# Patient Record
Sex: Male | Born: 1940 | Race: White | Hispanic: No | Marital: Married | Smoking: Former smoker
Health system: Southern US, Community
[De-identification: ages and names within clinical notes are randomized; demographics above are authoritative.]

## PROBLEM LIST (undated history)

## (undated) DIAGNOSIS — N400 Enlarged prostate without lower urinary tract symptoms: Secondary | ICD-10-CM

## (undated) DIAGNOSIS — R972 Elevated prostate specific antigen [PSA]: Secondary | ICD-10-CM

## (undated) DIAGNOSIS — K219 Gastro-esophageal reflux disease without esophagitis: Secondary | ICD-10-CM

## (undated) DIAGNOSIS — Z87442 Personal history of urinary calculi: Secondary | ICD-10-CM

## (undated) DIAGNOSIS — I10 Essential (primary) hypertension: Secondary | ICD-10-CM

## (undated) DIAGNOSIS — M1990 Unspecified osteoarthritis, unspecified site: Secondary | ICD-10-CM

## (undated) DIAGNOSIS — D329 Benign neoplasm of meninges, unspecified: Secondary | ICD-10-CM

## (undated) DIAGNOSIS — C4490 Unspecified malignant neoplasm of skin, unspecified: Secondary | ICD-10-CM

## (undated) HISTORY — PX: CYST REMOVAL HAND: SHX6279

## (undated) HISTORY — PX: EYE SURGERY: SHX253

## (undated) HISTORY — PX: VASECTOMY: SHX75

## (undated) HISTORY — PX: WISDOM TOOTH EXTRACTION: SHX21

## (undated) HISTORY — PX: TONSILLECTOMY: SUR1361

---

## 1998-10-20 ENCOUNTER — Other Ambulatory Visit: Admission: RE | Admit: 1998-10-20 | Discharge: 1998-10-20 | Payer: Self-pay | Admitting: Urology

## 2003-05-12 ENCOUNTER — Ambulatory Visit (HOSPITAL_COMMUNITY): Admission: RE | Admit: 2003-05-12 | Discharge: 2003-05-12 | Payer: Self-pay | Admitting: Gastroenterology

## 2003-06-09 ENCOUNTER — Ambulatory Visit (HOSPITAL_BASED_OUTPATIENT_CLINIC_OR_DEPARTMENT_OTHER): Admission: RE | Admit: 2003-06-09 | Discharge: 2003-06-09 | Payer: Self-pay | Admitting: Orthopedic Surgery

## 2003-06-09 ENCOUNTER — Ambulatory Visit (HOSPITAL_COMMUNITY): Admission: RE | Admit: 2003-06-09 | Discharge: 2003-06-09 | Payer: Self-pay | Admitting: Orthopedic Surgery

## 2007-12-05 ENCOUNTER — Encounter: Admission: RE | Admit: 2007-12-05 | Discharge: 2007-12-05 | Payer: Self-pay | Admitting: Family Medicine

## 2008-12-15 ENCOUNTER — Encounter: Admission: RE | Admit: 2008-12-15 | Discharge: 2008-12-15 | Payer: Self-pay | Admitting: Family Medicine

## 2010-08-12 NOTE — Op Note (Signed)
NAMESHAMARI, LOFQUIST                          ACCOUNT NO.:  [id number]   MEDICAL RECORD NO.:  [id number]                   PATIENT TYPE:  AMB   LOCATION:  DSC                                  FACILITY:  [facility]   PHYSICIAN:  Robert A. Thurston Hole, M.D.              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   OPERATION PERFORMED:  Right heel plantar fascia release with calcaneal  exostectomy.   SURGEON:  Elana Alm. Thurston Hole, M.D.   ASSISTANT:  Julien Girt, P.A.   ANESTHESIA:  Popliteal block.   OPERATIVE TIME:  30 minutes.   COMPLICATIONS:  None.   INDICATIONS FOR PROCEDURE:  Mr. Mcentee is a 70 year old gentleman who has  had six months of right heel pain increasing in nature with exam and x-rays  documenting plantar fasciitis, who has failed conservative care and is now  to undergo plantar fascial release.   DESCRIPTION OF PROCEDURE:  Mr. Bogle was brought to the operating room on  June 09, 2003 after a popliteal block had been placed in the holding room  by anesthesia.  He was placed on the operating table in supine position.  His right leg and foot was prepped using sterile DuraPrep and draped using  sterile technique.  The foot and leg was exsanguinated and a calf tourniquet  elevated to 275 mmHg.  Initially, through a 3 cm plantar medial incision  initial exposure was made.  The underlying subcutaneous tissues were incised  in line with a skin incision.  The medial plantar nerve carefully protected  while the plantar fascia was exposed.  A hemostat was placed, one superior  and one inferior to plantar fascia and then a knife was used to cut and  release the plantar fascia off of the calcaneal insertion.  Underneath  this,  a plantar spur was removed with a rongeur.  There was a small amount of  calcification remaining in the soft tissues plantarly and this was left  undisturbed.  Intraoperative fluoroscopy confirmed removal of the spur.  The  wound was then irrigated and closed using 2-0 Vicryl and 3-0 nylon.  Sterile  dressings were applied.  Tourniquet was released.  The patient was taken to  the recovery room in a stable condition.   FOLLOW UP:  Mr. Capek will be followed as an outpatient on Vicodin and  Naprosyn.  See him back in the office in a week for wound check and follow-  up.  Robert A. Thurston Hole, M.D.    RAW/MEDQ  D:  06/09/2003  T:  06/10/2003  Job:  [id number]

## 2010-08-12 NOTE — Op Note (Signed)
NAME:  Jared Shelton, Jared Shelton                          ACCOUNT NO.:  [id number]   MEDICAL RECORD NO.:  [id number]                   PATIENT TYPE:  AMB   LOCATION:  ENDO                                 FACILITY:  [facility]   PHYSICIAN:  John C. Madilyn Fireman, M.D.                 DATE OF BIRTH:  1941/03/13   DATE OF PROCEDURE:  05/12/2003  DATE OF DISCHARGE:                                 OPERATIVE REPORT   PROCEDURE:  Colonoscopy.   INDICATION FOR PROCEDURE:  Rectal bleeding and occasional fecal  incontinence.   DESCRIPTION OF PROCEDURE:  The patient was placed in the left lateral  decubitus position and placed on the pulse monitor with continuous low-flow  oxygen delivered by nasal cannula.  He was sedated with 75 mcg IV fentanyl  and 8 mg IV Versed.  The Olympus video colonoscope was inserted into the  rectum and advanced to the cecum, confirmed by transillumination of  McBurney's point and visualization of the ileocecal valve and appendiceal  orifice.  The prep was excellent.  The cecum, ascending, transverse,  descending, and sigmoid colon all appeared normal with no masses, polyps,  diverticula, or other mucosal abnormalities.  The rectum appeared normal  down to the anus, where there were some somewhat stretched internal  hemorrhoids without stigma or hemorrhage.  The scope was then withdrawn and  the patient returned to the recovery room in stable condition.  He tolerated  the procedure well, and there were no immediate complications.   IMPRESSION:  Enlarged internal hemorrhoids, otherwise normal study.   PLAN:  Will try Anusol-HC suppositories and consider next colon screening by  sigmoidoscopy in five years or colonoscopy in 10 years.                                               John C. Madilyn Fireman, M.D.    JCH/MEDQ  D:  05/12/2003  T:  05/12/2003  Job:  [id number]   cc:   Donia Guiles, M.D.  [address] [place]  [place] [address]  Fax: [phone]

## 2012-12-16 ENCOUNTER — Other Ambulatory Visit: Payer: Self-pay | Admitting: Gastroenterology

## 2012-12-16 DIAGNOSIS — R1310 Dysphagia, unspecified: Secondary | ICD-10-CM

## 2012-12-23 ENCOUNTER — Ambulatory Visit
Admission: RE | Admit: 2012-12-23 | Discharge: 2012-12-23 | Disposition: A | Discharge status: Home / Self Care | Payer: Medicare Other | Source: Ambulatory Visit | Attending: Gastroenterology | Admitting: Gastroenterology

## 2012-12-23 DIAGNOSIS — R1310 Dysphagia, unspecified: Secondary | ICD-10-CM

## 2015-05-03 DIAGNOSIS — E782 Mixed hyperlipidemia: Secondary | ICD-10-CM | POA: Diagnosis not present

## 2015-05-03 DIAGNOSIS — E669 Obesity, unspecified: Secondary | ICD-10-CM | POA: Diagnosis not present

## 2015-05-03 DIAGNOSIS — K5790 Diverticulosis of intestine, part unspecified, without perforation or abscess without bleeding: Secondary | ICD-10-CM | POA: Diagnosis not present

## 2015-05-03 DIAGNOSIS — N400 Enlarged prostate without lower urinary tract symptoms: Secondary | ICD-10-CM | POA: Diagnosis not present

## 2015-05-03 DIAGNOSIS — N529 Male erectile dysfunction, unspecified: Secondary | ICD-10-CM | POA: Diagnosis not present

## 2015-05-03 DIAGNOSIS — M1990 Unspecified osteoarthritis, unspecified site: Secondary | ICD-10-CM | POA: Diagnosis not present

## 2015-05-03 DIAGNOSIS — L719 Rosacea, unspecified: Secondary | ICD-10-CM | POA: Diagnosis not present

## 2015-05-03 DIAGNOSIS — Z6831 Body mass index (BMI) 31.0-31.9, adult: Secondary | ICD-10-CM | POA: Diagnosis not present

## 2015-05-03 DIAGNOSIS — Z0000 Encounter for general adult medical examination without abnormal findings: Secondary | ICD-10-CM | POA: Diagnosis not present

## 2015-05-03 DIAGNOSIS — I10 Essential (primary) hypertension: Secondary | ICD-10-CM | POA: Diagnosis not present

## 2015-05-03 DIAGNOSIS — K219 Gastro-esophageal reflux disease without esophagitis: Secondary | ICD-10-CM | POA: Diagnosis not present

## 2015-05-06 DIAGNOSIS — R509 Fever, unspecified: Secondary | ICD-10-CM | POA: Diagnosis not present

## 2015-05-06 DIAGNOSIS — N400 Enlarged prostate without lower urinary tract symptoms: Secondary | ICD-10-CM | POA: Diagnosis not present

## 2015-05-06 DIAGNOSIS — J111 Influenza due to unidentified influenza virus with other respiratory manifestations: Secondary | ICD-10-CM | POA: Diagnosis not present

## 2015-07-26 DIAGNOSIS — H2513 Age-related nuclear cataract, bilateral: Secondary | ICD-10-CM | POA: Diagnosis not present

## 2015-07-26 DIAGNOSIS — H25013 Cortical age-related cataract, bilateral: Secondary | ICD-10-CM | POA: Diagnosis not present

## 2015-11-03 DIAGNOSIS — N400 Enlarged prostate without lower urinary tract symptoms: Secondary | ICD-10-CM | POA: Diagnosis not present

## 2015-11-03 DIAGNOSIS — E669 Obesity, unspecified: Secondary | ICD-10-CM | POA: Diagnosis not present

## 2015-11-03 DIAGNOSIS — E782 Mixed hyperlipidemia: Secondary | ICD-10-CM | POA: Diagnosis not present

## 2015-11-03 DIAGNOSIS — I10 Essential (primary) hypertension: Secondary | ICD-10-CM | POA: Diagnosis not present

## 2015-11-03 DIAGNOSIS — H01009 Unspecified blepharitis unspecified eye, unspecified eyelid: Secondary | ICD-10-CM | POA: Diagnosis not present

## 2016-05-10 DIAGNOSIS — H524 Presbyopia: Secondary | ICD-10-CM | POA: Diagnosis not present

## 2016-06-19 DIAGNOSIS — L719 Rosacea, unspecified: Secondary | ICD-10-CM | POA: Diagnosis not present

## 2016-06-19 DIAGNOSIS — I10 Essential (primary) hypertension: Secondary | ICD-10-CM | POA: Diagnosis not present

## 2016-06-19 DIAGNOSIS — E782 Mixed hyperlipidemia: Secondary | ICD-10-CM | POA: Diagnosis not present

## 2016-06-19 DIAGNOSIS — K5790 Diverticulosis of intestine, part unspecified, without perforation or abscess without bleeding: Secondary | ICD-10-CM | POA: Diagnosis not present

## 2016-06-19 DIAGNOSIS — Z0000 Encounter for general adult medical examination without abnormal findings: Secondary | ICD-10-CM | POA: Diagnosis not present

## 2016-06-19 DIAGNOSIS — J309 Allergic rhinitis, unspecified: Secondary | ICD-10-CM | POA: Diagnosis not present

## 2016-06-19 DIAGNOSIS — K219 Gastro-esophageal reflux disease without esophagitis: Secondary | ICD-10-CM | POA: Diagnosis not present

## 2016-06-19 DIAGNOSIS — E669 Obesity, unspecified: Secondary | ICD-10-CM | POA: Diagnosis not present

## 2016-06-19 DIAGNOSIS — M1990 Unspecified osteoarthritis, unspecified site: Secondary | ICD-10-CM | POA: Diagnosis not present

## 2016-06-19 DIAGNOSIS — N529 Male erectile dysfunction, unspecified: Secondary | ICD-10-CM | POA: Diagnosis not present

## 2016-06-19 DIAGNOSIS — N400 Enlarged prostate without lower urinary tract symptoms: Secondary | ICD-10-CM | POA: Diagnosis not present

## 2016-07-11 DIAGNOSIS — H25043 Posterior subcapsular polar age-related cataract, bilateral: Secondary | ICD-10-CM | POA: Diagnosis not present

## 2016-07-11 DIAGNOSIS — H2511 Age-related nuclear cataract, right eye: Secondary | ICD-10-CM | POA: Diagnosis not present

## 2016-07-11 DIAGNOSIS — H25013 Cortical age-related cataract, bilateral: Secondary | ICD-10-CM | POA: Diagnosis not present

## 2016-07-11 DIAGNOSIS — H02839 Dermatochalasis of unspecified eye, unspecified eyelid: Secondary | ICD-10-CM | POA: Diagnosis not present

## 2016-07-11 DIAGNOSIS — H2513 Age-related nuclear cataract, bilateral: Secondary | ICD-10-CM | POA: Diagnosis not present

## 2016-07-28 DIAGNOSIS — H2511 Age-related nuclear cataract, right eye: Secondary | ICD-10-CM | POA: Diagnosis not present

## 2016-07-28 DIAGNOSIS — H25811 Combined forms of age-related cataract, right eye: Secondary | ICD-10-CM | POA: Diagnosis not present

## 2016-08-29 DIAGNOSIS — R194 Change in bowel habit: Secondary | ICD-10-CM | POA: Diagnosis not present

## 2017-01-10 ENCOUNTER — Other Ambulatory Visit: Payer: Self-pay | Admitting: Family Medicine

## 2017-01-10 DIAGNOSIS — R197 Diarrhea, unspecified: Secondary | ICD-10-CM | POA: Diagnosis not present

## 2017-01-10 DIAGNOSIS — Z23 Encounter for immunization: Secondary | ICD-10-CM | POA: Diagnosis not present

## 2017-01-10 DIAGNOSIS — R109 Unspecified abdominal pain: Secondary | ICD-10-CM

## 2017-01-15 ENCOUNTER — Ambulatory Visit
Admission: RE | Admit: 2017-01-15 | Discharge: 2017-01-15 | Disposition: A | Discharge status: Home / Self Care | Payer: PPO | Source: Ambulatory Visit | Attending: Family Medicine | Admitting: Family Medicine

## 2017-01-15 DIAGNOSIS — K5730 Diverticulosis of large intestine without perforation or abscess without bleeding: Secondary | ICD-10-CM | POA: Diagnosis not present

## 2017-01-15 DIAGNOSIS — R109 Unspecified abdominal pain: Secondary | ICD-10-CM

## 2017-01-15 MED ORDER — IOPAMIDOL (ISOVUE-300) INJECTION 61%
100.0000 mL | Freq: Once | INTRAVENOUS | Status: AC | PRN
  Administered 2017-01-15: 100 mL via INTRAVENOUS

## 2017-06-22 DIAGNOSIS — J329 Chronic sinusitis, unspecified: Secondary | ICD-10-CM | POA: Diagnosis not present

## 2017-06-22 DIAGNOSIS — R51 Headache: Secondary | ICD-10-CM | POA: Diagnosis not present

## 2017-06-29 DIAGNOSIS — K219 Gastro-esophageal reflux disease without esophagitis: Secondary | ICD-10-CM | POA: Diagnosis not present

## 2017-06-29 DIAGNOSIS — J309 Allergic rhinitis, unspecified: Secondary | ICD-10-CM | POA: Diagnosis not present

## 2017-06-29 DIAGNOSIS — Z125 Encounter for screening for malignant neoplasm of prostate: Secondary | ICD-10-CM | POA: Diagnosis not present

## 2017-06-29 DIAGNOSIS — L719 Rosacea, unspecified: Secondary | ICD-10-CM | POA: Diagnosis not present

## 2017-06-29 DIAGNOSIS — N400 Enlarged prostate without lower urinary tract symptoms: Secondary | ICD-10-CM | POA: Diagnosis not present

## 2017-06-29 DIAGNOSIS — I10 Essential (primary) hypertension: Secondary | ICD-10-CM | POA: Diagnosis not present

## 2017-06-29 DIAGNOSIS — K5790 Diverticulosis of intestine, part unspecified, without perforation or abscess without bleeding: Secondary | ICD-10-CM | POA: Diagnosis not present

## 2017-06-29 DIAGNOSIS — R972 Elevated prostate specific antigen [PSA]: Secondary | ICD-10-CM | POA: Diagnosis not present

## 2017-06-29 DIAGNOSIS — N529 Male erectile dysfunction, unspecified: Secondary | ICD-10-CM | POA: Diagnosis not present

## 2017-06-29 DIAGNOSIS — M1990 Unspecified osteoarthritis, unspecified site: Secondary | ICD-10-CM | POA: Diagnosis not present

## 2017-06-29 DIAGNOSIS — E782 Mixed hyperlipidemia: Secondary | ICD-10-CM | POA: Diagnosis not present

## 2017-06-29 DIAGNOSIS — Z0000 Encounter for general adult medical examination without abnormal findings: Secondary | ICD-10-CM | POA: Diagnosis not present

## 2017-11-19 ENCOUNTER — Encounter (HOSPITAL_COMMUNITY): Payer: Self-pay | Admitting: Emergency Medicine

## 2017-11-19 ENCOUNTER — Emergency Department (HOSPITAL_COMMUNITY)
Admission: EM | Admit: 2017-11-19 | Discharge: 2017-11-19 | Disposition: A | Discharge status: Home / Self Care | Payer: PPO | Attending: Emergency Medicine | Admitting: Emergency Medicine

## 2017-11-19 DIAGNOSIS — Z7902 Long term (current) use of antithrombotics/antiplatelets: Secondary | ICD-10-CM | POA: Insufficient documentation

## 2017-11-19 DIAGNOSIS — R55 Syncope and collapse: Secondary | ICD-10-CM | POA: Diagnosis not present

## 2017-11-19 DIAGNOSIS — I10 Essential (primary) hypertension: Secondary | ICD-10-CM | POA: Insufficient documentation

## 2017-11-19 DIAGNOSIS — Z7982 Long term (current) use of aspirin: Secondary | ICD-10-CM | POA: Insufficient documentation

## 2017-11-19 DIAGNOSIS — Z79899 Other long term (current) drug therapy: Secondary | ICD-10-CM | POA: Diagnosis not present

## 2017-11-19 DIAGNOSIS — I959 Hypotension, unspecified: Secondary | ICD-10-CM | POA: Diagnosis not present

## 2017-11-19 DIAGNOSIS — W19XXXA Unspecified fall, initial encounter: Secondary | ICD-10-CM | POA: Diagnosis not present

## 2017-11-19 HISTORY — DX: Essential (primary) hypertension: I10

## 2017-11-19 LAB — CBC WITH DIFFERENTIAL/PLATELET
Abs Immature Granulocytes: 0.1 10*3/uL (ref 0.0–0.1)
Basophils Absolute: 0.1 10*3/uL (ref 0.0–0.1)
Basophils Relative: 1 %
Eosinophils Absolute: 0.2 10*3/uL (ref 0.0–0.7)
Eosinophils Relative: 2 %
HEMATOCRIT: 44.7 % (ref 39.0–52.0)
HEMOGLOBIN: 14.3 g/dL (ref 13.0–17.0)
IMMATURE GRANULOCYTES: 1 %
LYMPHS ABS: 1.8 10*3/uL (ref 0.7–4.0)
LYMPHS PCT: 13 %
MCH: 28.3 pg (ref 26.0–34.0)
MCHC: 32 g/dL (ref 30.0–36.0)
MCV: 88.3 fL (ref 78.0–100.0)
Monocytes Absolute: 1 10*3/uL (ref 0.1–1.0)
Monocytes Relative: 7 %
NEUTROS PCT: 76 %
Neutro Abs: 10.8 10*3/uL — ABNORMAL HIGH (ref 1.7–7.7)
Platelets: 209 10*3/uL (ref 150–400)
RBC: 5.06 MIL/uL (ref 4.22–5.81)
RDW: 13.1 % (ref 11.5–15.5)
WBC: 13.9 10*3/uL — AB (ref 4.0–10.5)

## 2017-11-19 LAB — BASIC METABOLIC PANEL
Anion gap: 7 (ref 5–15)
BUN: 18 mg/dL (ref 8–23)
CHLORIDE: 106 mmol/L (ref 98–111)
CO2: 25 mmol/L (ref 22–32)
Calcium: 8.5 mg/dL — ABNORMAL LOW (ref 8.9–10.3)
Creatinine, Ser: 1.34 mg/dL — ABNORMAL HIGH (ref 0.61–1.24)
GFR calc non Af Amer: 50 mL/min — ABNORMAL LOW (ref >60)
GFR, EST AFRICAN AMERICAN: 58 mL/min — AB (ref >60)
Glucose, Bld: 105 mg/dL — ABNORMAL HIGH (ref 70–99)
Potassium: 4.5 mmol/L (ref 3.5–5.1)
SODIUM: 138 mmol/L (ref 135–145)

## 2017-11-19 MED ORDER — SODIUM CHLORIDE 0.9 % IV BOLUS
1000.0000 mL | Freq: Once | INTRAVENOUS | Status: AC
  Administered 2017-11-19: 1000 mL via INTRAVENOUS

## 2017-11-19 NOTE — Discharge Instructions (Signed)
If you develop recurrent lightheadedness, pass out again, or develop headache, vomiting, chest pain, shortness of breath, or palpitations or any other new/concerning symptoms and return to the ER for evaluation.  Otherwise follow-up closely with your primary care physician.

## 2017-11-19 NOTE — ED Provider Notes (Signed)
[facility] EMERGENCY DEPARTMENT Provider Note   CSN: [id number] Arrival date & time: 11/19/17  1247     History   Chief Complaint Chief Complaint  Patient presents with  . Loss of Consciousness    HPI Jared Shelton. is a 77 y.o. male.  HPI  77 year old male with a history of hypertension and hyperlipidemia presents after syncope.  The patient states he was outside watching workers take down a tree at his house.  He was bending over to look at branches and immediately felt lightheaded and passed out.  After feeling lightheaded the next thing he remembers was waking up with people around him.  Still felt lightheaded until halfway here.  Now feels fine.  Over the years he is intermittently felt brief lightheadedness that seems to improve whenever he sits down.  This is been ongoing for many years and rarely occurs.  Today there has been no headache, shortness of breath, chest pain, vomiting, or weakness/numbness.  He has not been feeling ill prior to this.  No recent change in his medications. No palpitations  Past Medical History:  Diagnosis Date  . Hypertension     There are no active problems to display for this patient.   History reviewed. No pertinent surgical history.      Home Medications    Prior to Admission medications   Medication Sig Start Date End Date Taking? Authorizing Provider  acetaminophen (TYLENOL) 500 MG tablet Take 500-1,000 mg by mouth every 6 (six) hours as needed for headache (pain).   Yes [provider]  amLODipine (NORVASC) 10 MG tablet Take 10 mg by mouth daily. 11/14/17  Yes [provider]  aspirin EC 81 MG tablet Take 81 mg by mouth daily.   Yes [provider]  OVER THE COUNTER MEDICATION Place 1 drop into both eyes daily as needed (dry eyes). Over the counter lubircating eye drop   Yes [provider]  pravastatin (PRAVACHOL) 40 MG tablet Take 40 mg by mouth daily. 09/03/17  Yes  [provider]    Family History No family history on file.  Social History Social History   Tobacco Use  . Smoking status: Not on file  Substance Use Topics  . Alcohol use: Not on file  . Drug use: Not on file     Allergies   Patient has no known allergies.   Review of Systems Review of Systems  Constitutional: Negative for fever.  Respiratory: Negative for shortness of breath.   Cardiovascular: Negative for chest pain, palpitations and leg swelling.  Gastrointestinal: Negative for abdominal pain, nausea and vomiting.  Neurological: Positive for syncope and light-headedness.  All other systems reviewed and are negative.    Physical Exam Updated Vital Signs BP 137/78   Pulse 60   Temp 98.1 F (36.7 C) (Oral)   Resp 20   SpO2 97%   Physical Exam  Constitutional: He is oriented to person, place, and time. He appears well-developed and well-nourished. No distress.  HENT:  Head: Normocephalic and atraumatic.  Right Ear: External ear normal.  Left Ear: External ear normal.  Nose: Nose normal.  Eyes: Pupils are equal, round, and reactive to light. EOM are normal. Right eye exhibits no discharge. Left eye exhibits no discharge.  Neck: Neck supple.  Cardiovascular: Normal rate, regular rhythm and normal heart sounds.  No murmur heard. Pulmonary/Chest: Effort normal and breath sounds normal.  Abdominal: Soft. There is no tenderness.  Musculoskeletal: He exhibits no  edema.  Neurological: He is alert and oriented to person, place, and time.  CN 3-12 grossly intact. 5/5 strength in all 4 extremities. Grossly normal sensation. Normal finger to nose.   Skin: Skin is warm and dry. He is not diaphoretic.  Nursing note and vitals reviewed.    ED Treatments / Results  Labs (all labs ordered are listed, but only abnormal results are displayed) Labs Reviewed  BASIC METABOLIC PANEL - Abnormal; Notable for the following components:      Result Value   Glucose,  Bld 105 (*)    Creatinine, Ser 1.34 (*)    Calcium 8.5 (*)    GFR calc non Af Amer 50 (*)    GFR calc Af Amer 58 (*)    All other components within normal limits  CBC WITH DIFFERENTIAL/PLATELET - Abnormal; Notable for the following components:   WBC 13.9 (*)    Neutro Abs 10.8 (*)    All other components within normal limits    EKG EKG Interpretation  Date/Time:  Monday November 19 2017 12:58:04 EDT Ventricular Rate:  54 PR Interval:    QRS Duration: 88 QT Interval:  425 QTC Calculation: 403 R Axis:   -40 Text Interpretation:  Sinus bradycardia Left axis deviation Minimal ST elevation, anterior leads no significant change since 2005 Confirmed by Sherwood Gambler [phone]) on 11/19/2017 1:03:40 PM   Radiology No results found.  Procedures Procedures (including critical care time)  Medications Ordered in ED Medications  sodium chloride 0.9 % bolus 1,000 mL (0 mLs Intravenous Stopped 11/19/17 1518)     Initial Impression / Assessment and Plan / ED Course  I have reviewed the triage vital signs and the nursing notes.  Pertinent labs & imaging results that were available during my care of the patient were reviewed by me and considered in my medical decision making (see chart for details).     Patient continues to feel well.  No cardiac symptoms such as chest pain, dyspnea, or palpitations.  Labs do not show any acute emergent abnormalities.  He was given IV fluids.  Unclear why his WBC is elevated but he has no acute infectious symptoms and has not been feeling ill prior to this.  Most likely this is related to the acute positional change.  Given his age I discussed possible arrhythmia and he understands the possible severity of this but was still wants to go home.  Thus I think is reasonable for him to go home but we discussed strict return precautions.  I highly doubt ACS, PE, or acute neurologic abnormality with no other symptoms.  Discharged home with return precautions.  Final  Clinical Impressions(s) / ED Diagnoses   Final diagnoses:  Syncope and collapse    ED Discharge Orders    None       Sherwood Gambler, MD 11/19/17 [phone]

## 2017-11-19 NOTE — ED Triage Notes (Signed)
Pt arrives from home by ems after having a syncopal event while outside watching a tree service cut down trees. Pt states about 1 hour ago he was outside and bent down to look at a tree limb and remembers getting dizzy and then woke up on the ground. Pt states now he feels normal, no chest pain or dizziness.  EKG sinus brady.

## 2017-11-20 DIAGNOSIS — R42 Dizziness and giddiness: Secondary | ICD-10-CM | POA: Diagnosis not present

## 2017-11-20 DIAGNOSIS — M25551 Pain in right hip: Secondary | ICD-10-CM | POA: Diagnosis not present

## 2017-11-20 DIAGNOSIS — S00412A Abrasion of left ear, initial encounter: Secondary | ICD-10-CM | POA: Diagnosis not present

## 2018-01-02 ENCOUNTER — Ambulatory Visit
Admission: RE | Admit: 2018-01-02 | Discharge: 2018-01-02 | Disposition: A | Discharge status: Home / Self Care | Payer: PPO | Source: Ambulatory Visit | Attending: Family Medicine | Admitting: Family Medicine

## 2018-01-02 ENCOUNTER — Other Ambulatory Visit: Payer: Self-pay | Admitting: Family Medicine

## 2018-01-02 DIAGNOSIS — N400 Enlarged prostate without lower urinary tract symptoms: Secondary | ICD-10-CM | POA: Diagnosis not present

## 2018-01-02 DIAGNOSIS — I10 Essential (primary) hypertension: Secondary | ICD-10-CM | POA: Diagnosis not present

## 2018-01-02 DIAGNOSIS — M25551 Pain in right hip: Secondary | ICD-10-CM

## 2018-01-02 DIAGNOSIS — Z6831 Body mass index (BMI) 31.0-31.9, adult: Secondary | ICD-10-CM | POA: Diagnosis not present

## 2018-01-02 DIAGNOSIS — R972 Elevated prostate specific antigen [PSA]: Secondary | ICD-10-CM | POA: Diagnosis not present

## 2018-01-02 DIAGNOSIS — R400 Somnolence: Secondary | ICD-10-CM | POA: Diagnosis not present

## 2018-01-02 DIAGNOSIS — Z23 Encounter for immunization: Secondary | ICD-10-CM | POA: Diagnosis not present

## 2018-01-02 DIAGNOSIS — L989 Disorder of the skin and subcutaneous tissue, unspecified: Secondary | ICD-10-CM | POA: Diagnosis not present

## 2018-01-02 DIAGNOSIS — M1611 Unilateral primary osteoarthritis, right hip: Secondary | ICD-10-CM | POA: Diagnosis not present

## 2018-01-02 DIAGNOSIS — E782 Mixed hyperlipidemia: Secondary | ICD-10-CM | POA: Diagnosis not present

## 2018-01-02 DIAGNOSIS — E669 Obesity, unspecified: Secondary | ICD-10-CM | POA: Diagnosis not present

## 2018-01-02 DIAGNOSIS — R5383 Other fatigue: Secondary | ICD-10-CM | POA: Diagnosis not present

## 2018-01-14 DIAGNOSIS — M25551 Pain in right hip: Secondary | ICD-10-CM | POA: Diagnosis not present

## 2018-01-22 DIAGNOSIS — M25551 Pain in right hip: Secondary | ICD-10-CM | POA: Diagnosis not present

## 2018-01-29 DIAGNOSIS — M25551 Pain in right hip: Secondary | ICD-10-CM | POA: Diagnosis not present

## 2018-10-21 DIAGNOSIS — R972 Elevated prostate specific antigen [PSA]: Secondary | ICD-10-CM | POA: Diagnosis not present

## 2018-10-21 DIAGNOSIS — I10 Essential (primary) hypertension: Secondary | ICD-10-CM | POA: Diagnosis not present

## 2018-10-21 DIAGNOSIS — Z125 Encounter for screening for malignant neoplasm of prostate: Secondary | ICD-10-CM | POA: Diagnosis not present

## 2018-10-23 DIAGNOSIS — J309 Allergic rhinitis, unspecified: Secondary | ICD-10-CM | POA: Diagnosis not present

## 2018-10-23 DIAGNOSIS — L719 Rosacea, unspecified: Secondary | ICD-10-CM | POA: Diagnosis not present

## 2018-10-23 DIAGNOSIS — I10 Essential (primary) hypertension: Secondary | ICD-10-CM | POA: Diagnosis not present

## 2018-10-23 DIAGNOSIS — N529 Male erectile dysfunction, unspecified: Secondary | ICD-10-CM | POA: Diagnosis not present

## 2018-10-23 DIAGNOSIS — E782 Mixed hyperlipidemia: Secondary | ICD-10-CM | POA: Diagnosis not present

## 2018-10-23 DIAGNOSIS — Z0000 Encounter for general adult medical examination without abnormal findings: Secondary | ICD-10-CM | POA: Diagnosis not present

## 2018-10-23 DIAGNOSIS — M1990 Unspecified osteoarthritis, unspecified site: Secondary | ICD-10-CM | POA: Diagnosis not present

## 2018-10-23 DIAGNOSIS — Z7189 Other specified counseling: Secondary | ICD-10-CM | POA: Diagnosis not present

## 2018-10-23 DIAGNOSIS — K219 Gastro-esophageal reflux disease without esophagitis: Secondary | ICD-10-CM | POA: Diagnosis not present

## 2018-10-23 DIAGNOSIS — Z125 Encounter for screening for malignant neoplasm of prostate: Secondary | ICD-10-CM | POA: Diagnosis not present

## 2018-10-23 DIAGNOSIS — K5790 Diverticulosis of intestine, part unspecified, without perforation or abscess without bleeding: Secondary | ICD-10-CM | POA: Diagnosis not present

## 2018-10-23 DIAGNOSIS — N400 Enlarged prostate without lower urinary tract symptoms: Secondary | ICD-10-CM | POA: Diagnosis not present

## 2019-02-13 DIAGNOSIS — H35033 Hypertensive retinopathy, bilateral: Secondary | ICD-10-CM | POA: Diagnosis not present

## 2019-02-13 DIAGNOSIS — H524 Presbyopia: Secondary | ICD-10-CM | POA: Diagnosis not present

## 2019-04-10 ENCOUNTER — Ambulatory Visit: Payer: Medicare Other | Attending: Internal Medicine

## 2019-04-10 DIAGNOSIS — Z23 Encounter for immunization: Secondary | ICD-10-CM

## 2019-04-10 NOTE — Progress Notes (Signed)
   [id number] [facility]ination [facility]  Name:  Ryer Kerwick.    MRN: [id number] DOB: 10-27-1940  04/10/2019  Mr. Muratalla was observed post Covid-19 immunization for 15 minutes without incidence. He was provided with Vaccine Information Sheet and instruction to access the V-Safe system.   Mr. Leichtman was instructed to call 911 with any severe reactions post vaccine: Marland Kitchen Difficulty breathing  . Swelling of your face and throat  . A fast heartbeat  . A bad rash all over your body  . Dizziness and weakness    Immunizations Administered    Name Date Dose VIS Date Route   Pfizer COVID-19 Vaccine 04/10/2019  8:37 AM 0.3 mL 03/07/2019 Intramuscular   Manufacturer: [place]   Lot: [id number]   [place]: [id number]

## 2019-04-25 DIAGNOSIS — L719 Rosacea, unspecified: Secondary | ICD-10-CM | POA: Diagnosis not present

## 2019-04-25 DIAGNOSIS — N400 Enlarged prostate without lower urinary tract symptoms: Secondary | ICD-10-CM | POA: Diagnosis not present

## 2019-04-25 DIAGNOSIS — E782 Mixed hyperlipidemia: Secondary | ICD-10-CM | POA: Diagnosis not present

## 2019-04-25 DIAGNOSIS — K219 Gastro-esophageal reflux disease without esophagitis: Secondary | ICD-10-CM | POA: Diagnosis not present

## 2019-04-25 DIAGNOSIS — I10 Essential (primary) hypertension: Secondary | ICD-10-CM | POA: Diagnosis not present

## 2019-04-28 ENCOUNTER — Ambulatory Visit: Payer: PPO | Attending: Internal Medicine

## 2019-04-28 ENCOUNTER — Ambulatory Visit: Payer: PPO

## 2019-04-28 DIAGNOSIS — Z23 Encounter for immunization: Secondary | ICD-10-CM | POA: Insufficient documentation

## 2019-04-28 NOTE — Progress Notes (Signed)
   [id number] Vaccination Clinic  Name:  Kito Lacross.    MRN: [id number] DOB: 08/22/1940  04/28/2019  Mr. Patriarca was observed post Covid-19 immunization for 15 minutes without incidence. He was provided with Vaccine Information Sheet and instruction to access the V-Safe system.   Mr. Tuccio was instructed to call 911 with any severe reactions post vaccine: Marland Kitchen Difficulty breathing  . Swelling of your face and throat  . A fast heartbeat  . A bad rash all over your body  . Dizziness and weakness    Immunizations Administered    Name Date Dose VIS Date Route   Pfizer COVID-19 Vaccine 04/28/2019 10:20 AM 0.3 mL 03/07/2019 Intramuscular   Manufacturer: [place]   Lot: [id number]   [place]: [id number]

## 2019-05-12 DIAGNOSIS — N5201 Erectile dysfunction due to arterial insufficiency: Secondary | ICD-10-CM | POA: Diagnosis not present

## 2019-05-12 DIAGNOSIS — N403 Nodular prostate with lower urinary tract symptoms: Secondary | ICD-10-CM | POA: Diagnosis not present

## 2019-05-12 DIAGNOSIS — R972 Elevated prostate specific antigen [PSA]: Secondary | ICD-10-CM | POA: Diagnosis not present

## 2019-05-12 DIAGNOSIS — R3915 Urgency of urination: Secondary | ICD-10-CM | POA: Diagnosis not present

## 2019-05-12 DIAGNOSIS — R351 Nocturia: Secondary | ICD-10-CM | POA: Diagnosis not present

## 2019-10-28 DIAGNOSIS — K219 Gastro-esophageal reflux disease without esophagitis: Secondary | ICD-10-CM | POA: Diagnosis not present

## 2019-10-28 DIAGNOSIS — K5790 Diverticulosis of intestine, part unspecified, without perforation or abscess without bleeding: Secondary | ICD-10-CM | POA: Diagnosis not present

## 2019-10-28 DIAGNOSIS — J309 Allergic rhinitis, unspecified: Secondary | ICD-10-CM | POA: Diagnosis not present

## 2019-10-28 DIAGNOSIS — Z0000 Encounter for general adult medical examination without abnormal findings: Secondary | ICD-10-CM | POA: Diagnosis not present

## 2019-10-28 DIAGNOSIS — N529 Male erectile dysfunction, unspecified: Secondary | ICD-10-CM | POA: Diagnosis not present

## 2019-10-28 DIAGNOSIS — L719 Rosacea, unspecified: Secondary | ICD-10-CM | POA: Diagnosis not present

## 2019-10-28 DIAGNOSIS — C44319 Basal cell carcinoma of skin of other parts of face: Secondary | ICD-10-CM | POA: Diagnosis not present

## 2019-10-28 DIAGNOSIS — C44612 Basal cell carcinoma of skin of right upper limb, including shoulder: Secondary | ICD-10-CM | POA: Diagnosis not present

## 2019-10-28 DIAGNOSIS — I10 Essential (primary) hypertension: Secondary | ICD-10-CM | POA: Diagnosis not present

## 2019-10-28 DIAGNOSIS — E782 Mixed hyperlipidemia: Secondary | ICD-10-CM | POA: Diagnosis not present

## 2019-10-28 DIAGNOSIS — N400 Enlarged prostate without lower urinary tract symptoms: Secondary | ICD-10-CM | POA: Diagnosis not present

## 2019-10-28 DIAGNOSIS — M1990 Unspecified osteoarthritis, unspecified site: Secondary | ICD-10-CM | POA: Diagnosis not present

## 2020-04-29 DIAGNOSIS — N400 Enlarged prostate without lower urinary tract symptoms: Secondary | ICD-10-CM | POA: Diagnosis not present

## 2020-04-29 DIAGNOSIS — I10 Essential (primary) hypertension: Secondary | ICD-10-CM | POA: Diagnosis not present

## 2020-04-29 DIAGNOSIS — E782 Mixed hyperlipidemia: Secondary | ICD-10-CM | POA: Diagnosis not present

## 2020-10-29 ENCOUNTER — Other Ambulatory Visit: Payer: Self-pay | Admitting: Advanced Practice Midwife

## 2020-10-29 DIAGNOSIS — M1990 Unspecified osteoarthritis, unspecified site: Secondary | ICD-10-CM | POA: Diagnosis not present

## 2020-10-29 DIAGNOSIS — R1310 Dysphagia, unspecified: Secondary | ICD-10-CM | POA: Diagnosis not present

## 2020-10-29 DIAGNOSIS — Z0000 Encounter for general adult medical examination without abnormal findings: Secondary | ICD-10-CM | POA: Diagnosis not present

## 2020-10-29 DIAGNOSIS — E782 Mixed hyperlipidemia: Secondary | ICD-10-CM | POA: Diagnosis not present

## 2020-10-29 DIAGNOSIS — I10 Essential (primary) hypertension: Secondary | ICD-10-CM | POA: Diagnosis not present

## 2020-10-29 DIAGNOSIS — L719 Rosacea, unspecified: Secondary | ICD-10-CM | POA: Diagnosis not present

## 2020-10-29 DIAGNOSIS — N529 Male erectile dysfunction, unspecified: Secondary | ICD-10-CM | POA: Diagnosis not present

## 2020-10-29 DIAGNOSIS — J309 Allergic rhinitis, unspecified: Secondary | ICD-10-CM | POA: Diagnosis not present

## 2020-10-29 DIAGNOSIS — K5790 Diverticulosis of intestine, part unspecified, without perforation or abscess without bleeding: Secondary | ICD-10-CM | POA: Diagnosis not present

## 2020-10-29 DIAGNOSIS — N400 Enlarged prostate without lower urinary tract symptoms: Secondary | ICD-10-CM | POA: Diagnosis not present

## 2020-11-05 ENCOUNTER — Other Ambulatory Visit: Payer: Self-pay | Admitting: Family Medicine

## 2020-11-05 ENCOUNTER — Ambulatory Visit
Admission: RE | Admit: 2020-11-05 | Discharge: 2020-11-05 | Disposition: A | Discharge status: Home / Self Care | Payer: PPO | Source: Ambulatory Visit | Attending: Family Medicine | Admitting: Family Medicine

## 2020-11-05 ENCOUNTER — Ambulatory Visit
Admission: RE | Admit: 2020-11-05 | Discharge: 2020-11-05 | Disposition: A | Discharge status: Home / Self Care | Payer: PPO | Source: Ambulatory Visit | Attending: Advanced Practice Midwife | Admitting: Advanced Practice Midwife

## 2020-11-05 DIAGNOSIS — R1310 Dysphagia, unspecified: Secondary | ICD-10-CM

## 2020-11-05 DIAGNOSIS — K219 Gastro-esophageal reflux disease without esophagitis: Secondary | ICD-10-CM | POA: Diagnosis not present

## 2020-11-05 DIAGNOSIS — K449 Diaphragmatic hernia without obstruction or gangrene: Secondary | ICD-10-CM | POA: Diagnosis not present

## 2020-11-10 DIAGNOSIS — R351 Nocturia: Secondary | ICD-10-CM | POA: Diagnosis not present

## 2020-11-10 DIAGNOSIS — R972 Elevated prostate specific antigen [PSA]: Secondary | ICD-10-CM | POA: Diagnosis not present

## 2020-11-10 DIAGNOSIS — R3915 Urgency of urination: Secondary | ICD-10-CM | POA: Diagnosis not present

## 2020-11-10 DIAGNOSIS — R350 Frequency of micturition: Secondary | ICD-10-CM | POA: Diagnosis not present

## 2021-01-24 DIAGNOSIS — K5900 Constipation, unspecified: Secondary | ICD-10-CM | POA: Diagnosis not present

## 2021-01-24 DIAGNOSIS — R1310 Dysphagia, unspecified: Secondary | ICD-10-CM | POA: Diagnosis not present

## 2021-05-02 DIAGNOSIS — E669 Obesity, unspecified: Secondary | ICD-10-CM | POA: Diagnosis not present

## 2021-05-02 DIAGNOSIS — K5900 Constipation, unspecified: Secondary | ICD-10-CM | POA: Diagnosis not present

## 2021-05-02 DIAGNOSIS — E782 Mixed hyperlipidemia: Secondary | ICD-10-CM | POA: Diagnosis not present

## 2021-05-02 DIAGNOSIS — N400 Enlarged prostate without lower urinary tract symptoms: Secondary | ICD-10-CM | POA: Diagnosis not present

## 2021-05-02 DIAGNOSIS — R109 Unspecified abdominal pain: Secondary | ICD-10-CM | POA: Diagnosis not present

## 2021-05-02 DIAGNOSIS — I10 Essential (primary) hypertension: Secondary | ICD-10-CM | POA: Diagnosis not present

## 2021-05-02 DIAGNOSIS — K219 Gastro-esophageal reflux disease without esophagitis: Secondary | ICD-10-CM | POA: Diagnosis not present

## 2021-05-04 DIAGNOSIS — R194 Change in bowel habit: Secondary | ICD-10-CM | POA: Diagnosis not present

## 2021-05-04 DIAGNOSIS — N401 Enlarged prostate with lower urinary tract symptoms: Secondary | ICD-10-CM | POA: Diagnosis not present

## 2021-05-04 DIAGNOSIS — K5900 Constipation, unspecified: Secondary | ICD-10-CM | POA: Diagnosis not present

## 2021-05-04 DIAGNOSIS — K219 Gastro-esophageal reflux disease without esophagitis: Secondary | ICD-10-CM | POA: Diagnosis not present

## 2021-05-05 DIAGNOSIS — K5900 Constipation, unspecified: Secondary | ICD-10-CM | POA: Diagnosis not present

## 2021-05-05 DIAGNOSIS — R32 Unspecified urinary incontinence: Secondary | ICD-10-CM | POA: Diagnosis not present

## 2021-05-05 DIAGNOSIS — K5790 Diverticulosis of intestine, part unspecified, without perforation or abscess without bleeding: Secondary | ICD-10-CM | POA: Diagnosis not present

## 2021-05-05 DIAGNOSIS — R109 Unspecified abdominal pain: Secondary | ICD-10-CM | POA: Diagnosis not present

## 2021-05-30 DIAGNOSIS — N179 Acute kidney failure, unspecified: Secondary | ICD-10-CM | POA: Diagnosis not present

## 2021-06-01 DIAGNOSIS — K5900 Constipation, unspecified: Secondary | ICD-10-CM | POA: Diagnosis not present

## 2021-06-17 DIAGNOSIS — N179 Acute kidney failure, unspecified: Secondary | ICD-10-CM | POA: Diagnosis not present

## 2021-06-17 DIAGNOSIS — R590 Localized enlarged lymph nodes: Secondary | ICD-10-CM | POA: Diagnosis not present

## 2021-06-17 DIAGNOSIS — M1990 Unspecified osteoarthritis, unspecified site: Secondary | ICD-10-CM | POA: Diagnosis not present

## 2021-06-17 DIAGNOSIS — E782 Mixed hyperlipidemia: Secondary | ICD-10-CM | POA: Diagnosis not present

## 2021-06-17 DIAGNOSIS — R32 Unspecified urinary incontinence: Secondary | ICD-10-CM | POA: Diagnosis not present

## 2021-06-17 DIAGNOSIS — K5900 Constipation, unspecified: Secondary | ICD-10-CM | POA: Diagnosis not present

## 2021-06-17 DIAGNOSIS — N400 Enlarged prostate without lower urinary tract symptoms: Secondary | ICD-10-CM | POA: Diagnosis not present

## 2021-06-17 DIAGNOSIS — K219 Gastro-esophageal reflux disease without esophagitis: Secondary | ICD-10-CM | POA: Diagnosis not present

## 2021-06-27 DIAGNOSIS — R3915 Urgency of urination: Secondary | ICD-10-CM | POA: Diagnosis not present

## 2021-06-27 DIAGNOSIS — R972 Elevated prostate specific antigen [PSA]: Secondary | ICD-10-CM | POA: Diagnosis not present

## 2021-06-27 DIAGNOSIS — N403 Nodular prostate with lower urinary tract symptoms: Secondary | ICD-10-CM | POA: Diagnosis not present

## 2021-06-27 DIAGNOSIS — R351 Nocturia: Secondary | ICD-10-CM | POA: Diagnosis not present

## 2021-06-29 ENCOUNTER — Other Ambulatory Visit: Payer: Self-pay | Admitting: Urology

## 2021-06-29 ENCOUNTER — Other Ambulatory Visit (HOSPITAL_COMMUNITY): Payer: Self-pay | Admitting: Urology

## 2021-06-29 DIAGNOSIS — N138 Other obstructive and reflux uropathy: Secondary | ICD-10-CM

## 2021-07-05 ENCOUNTER — Encounter (HOSPITAL_COMMUNITY)
Admission: RE | Admit: 2021-07-05 | Discharge: 2021-07-05 | Disposition: A | Discharge status: Home / Self Care | Payer: PPO | Source: Ambulatory Visit | Attending: Urology | Admitting: Urology

## 2021-07-05 ENCOUNTER — Encounter (HOSPITAL_COMMUNITY): Payer: Self-pay

## 2021-07-05 ENCOUNTER — Other Ambulatory Visit: Payer: Self-pay

## 2021-07-05 DIAGNOSIS — Z01818 Encounter for other preprocedural examination: Secondary | ICD-10-CM | POA: Insufficient documentation

## 2021-07-05 DIAGNOSIS — I2510 Atherosclerotic heart disease of native coronary artery without angina pectoris: Secondary | ICD-10-CM | POA: Insufficient documentation

## 2021-07-05 HISTORY — DX: Elevated prostate specific antigen (PSA): R97.20

## 2021-07-05 HISTORY — DX: Unspecified osteoarthritis, unspecified site: M19.90

## 2021-07-05 HISTORY — DX: Benign prostatic hyperplasia without lower urinary tract symptoms: N40.0

## 2021-07-05 HISTORY — DX: Unspecified malignant neoplasm of skin, unspecified: C44.90

## 2021-07-05 HISTORY — DX: Gastro-esophageal reflux disease without esophagitis: K21.9

## 2021-07-05 LAB — CBC
HCT: 43.5 % (ref 39.0–52.0)
Hemoglobin: 13.7 g/dL (ref 13.0–17.0)
MCH: 26.9 pg (ref 26.0–34.0)
MCHC: 31.5 g/dL (ref 30.0–36.0)
MCV: 85.3 fL (ref 80.0–100.0)
Platelets: 257 10*3/uL (ref 150–400)
RBC: 5.1 MIL/uL (ref 4.22–5.81)
RDW: 14.5 % (ref 11.5–15.5)
WBC: 11 10*3/uL — ABNORMAL HIGH (ref 4.0–10.5)
nRBC: 0 % (ref 0.0–0.2)

## 2021-07-05 LAB — BASIC METABOLIC PANEL
Anion gap: 7 (ref 5–15)
BUN: 25 mg/dL — ABNORMAL HIGH (ref 8–23)
CO2: 25 mmol/L (ref 22–32)
Calcium: 9.2 mg/dL (ref 8.9–10.3)
Chloride: 107 mmol/L (ref 98–111)
Creatinine, Ser: 1.18 mg/dL (ref 0.61–1.24)
GFR, Estimated: >60 mL/min (ref >60)
Glucose, Bld: 113 mg/dL — ABNORMAL HIGH (ref 70–99)
Potassium: 4.3 mmol/L (ref 3.5–5.1)
Sodium: 139 mmol/L (ref 135–145)

## 2021-07-05 NOTE — Progress Notes (Signed)
COVID swab appointment: ? ?COVID Vaccine Completed:  Yes x2 ?Date COVID Vaccine completed: 04-10-19 04-28-19 ?Has received booster: ?COVID vaccine manufacturer: [place]  ? ?Date of COVID positive in last 90 days: ? ?PCP - Mayra Neer, MD ?Cardiologist -  ? ?Chest x-ray -  ?EKG -  ?Stress Test -  ?ECHO -  ?Cardiac Cath -  ?Pacemaker/ICD device last checked: ?Spinal Cord Stimulator: ? ?Bowel Prep -  ? ?Sleep Study -  ?CPAP -  ? ?Fasting Blood Sugar -  ?Checks Blood Sugar _____ times a day ? ?Blood Thinner Instructions: ?Aspirin Instructions: ?Last Dose: ? ?Activity level:  Can go up a flight of stairs and perform activities of daily living without stopping and without symptoms of chest pain or shortness of breath. ?  Able to exercise without symptoms ? ?Unable to go up a flight of stairs without symptoms of  ?   ? ?Anesthesia review:  ? ?Patient denies shortness of breath, fever, cough and chest pain at PAT appointment ? ? ?Patient verbalized understanding of instructions that were given to them at the PAT appointment. Patient was also instructed that they will need to review over the PAT instructions again at home before surgery.  ?

## 2021-07-05 NOTE — Progress Notes (Addendum)
COVID Vaccine Completed:  Yes x2 ?Date COVID Vaccine completed: 04-10-19 04-28-19 ?Has received booster:  No ?COVID vaccine manufacturer: Pfizer  ?  ?Date of COVID positive in last 90 days:  No ?  ?PCP - Mayra Neer, MD ?Cardiologist - N/A ?  ?Chest x-ray - N/A ?EKG - 07-05-21 Epic ?Stress Test - N/A ?ECHO - N/A ?Cardiac Cath - N/A ?Pacemaker/ICD device last checked: ?Spinal Cord Stimulator: ?  ?Bowel Prep - Fleet enema.  Patient aware ?  ?Sleep Study - N/A ?CPAP -  ?  ?Fasting Blood Sugar - N/A ?Checks Blood Sugar _____ times a day ?  ?Blood Thinner Instructions: ?Aspirin Instructions:  ASA 81 mg.  To stop one week prior per patient ?Last Dose: ?  ?Activity level:   Can go up a flight of stairs and perform activities of daily living without stopping and without symptoms of chest pain or shortness of breath.  Able to exercise without symptoms ?  ?Anesthesia review: N/A ?  ?Patient denies shortness of breath, fever, cough and chest pain at PAT appointment ?  ?  ?Patient verbalized understanding of instructions that were given to them at the PAT appointment. Patient was also instructed that they will need to review over the PAT instructions again at home before surgery.  ?

## 2021-07-05 NOTE — Patient Instructions (Signed)
DUE TO COVID-19 ONLY TWO VISITORS  (aged 81 and older)  IS ALLOWED TO COME WITH YOU AND STAY IN THE WAITING ROOM ONLY DURING PRE OP AND PROCEDURE.   ?**NO VISITORS ARE ALLOWED IN THE SHORT STAY AREA OR RECOVERY ROOM!!** ? ?IF YOU WILL BE ADMITTED INTO THE HOSPITAL YOU ARE ALLOWED ONLY FOUR SUPPORT PEOPLE DURING VISITATION HOURS ONLY (7 AM -8PM)   ?The support person(s) must pass our screening, gel in and out ?Visitors GUEST BADGE MUST BE WORN VISIBLY  ?One adult visitor may remain with you overnight and MUST be in the room by 8 P.M.  ? ?You are not required to quarantine, however you are required to wear a well-fitted mask when you are out and around people not in your household.  ?Hand Hygiene often ?Do NOT share personal items ?Notify your provider if you are in close contact with someone who has COVID or you develop fever 100.4 or greater, new onset of sneezing, cough, sore throat, shortness of breath or body aches. ? ?     ? Your procedure is scheduled on:   ? ? Report to [facility] Main Entrance ? ?  Report to admitting at AM ? ? Call this number if you have problems the morning of surgery [phone] ? ? Do not eat food :After Midnight. ? ? After Midnight you may have the following liquids until ______ AM/ PM DAY OF SURGERY ? ?Water ?Black Coffee (sugar ok, NO MILK/CREAM OR CREAMERS)  ?Tea (sugar ok, NO MILK/CREAM OR CREAMERS) regular and decaf                             ?Plain Jell-O (NO RED)                                           ?Fruit ices (not with fruit pulp, NO RED)                                     ?Popsicles (NO RED)                                                                  ?Juice: apple, WHITE grape, WHITE cranberry ?Sports drinks like Gatorade (NO RED) ?Clear broth(vegetable,chicken,beef) ? ?             ?Drink 1 Ensure/G2 drink AT 10:00 AM/PM the morning of surgery. ? ?Drink 2 Ensure/G2 drinks by 10:00 PM the night before surgery   ? ?  ?  ?The day of surgery:  ?Drink ONE  (1) Pre-Surgery Clear Ensure or G2 at AM the morning of surgery. Drink in one sitting. Do not sip.  ?This drink was given to you during your hospital  ?pre-op appointment visit. ?Nothing else to drink after completing the  ?Pre-Surgery Clear Ensure or G2. ?  ?       If you have questions, please contact your surgeon?s office. ? ? ?FOLLOW BOWEL PREP AND ANY ADDITIONAL PRE OP INSTRUCTIONS YOU RECEIVED FROM YOUR SURGEON'S OFFICE!!! ?  ?  ?  Oral Hygiene is also important to reduce your risk of infection.                                    ?Remember - BRUSH YOUR TEETH THE MORNING OF SURGERY WITH YOUR REGULAR TOOTHPASTE ? ? Do NOT smoke after Midnight ? ? Take these medicines the morning of surgery with A SIP OF WATER:  ? ?DO NOT TAKE ANY ORAL DIABETIC MEDICATIONS DAY OF YOUR SURGERY ? ?Bring CPAP mask and tubing day of surgery. ?                  ?           You may not have any metal on your body including hair pins, jewelry, and body piercing ? ?           Do not wear make-up, lotions, powders, perfumes/cologne, or deodorant ? ?Do not wear nail polish including gel and S&S, artificial/acrylic nails, or any other type of covering on natural nails including finger and toenails. If you have artificial nails, gel coating, etc. that needs to be removed by a nail salon please have this removed prior to surgery or surgery may need to be canceled/ delayed if the surgeon/ anesthesia feels like they are unable to be safely monitored.  ? ?Do not shave  48 hours prior to surgery.  ? ?            Men may shave face and neck. ? ? Do not bring valuables to the hospital. [place]. ? ? Contacts, dentures or bridgework may not be worn into surgery. ? ? Bring small overnight bag day of surgery. ?  ?Patients discharged on the day of surgery will not be allowed to drive home.  Someone NEEDS to stay with you for the first 24 hours after anesthesia. ? ?Special Instructions: Bring a copy of your healthcare  power of attorney and living will documents         the day of surgery if you haven't scanned them before. ? ?Please read over the following fact sheets you were given: IF [place] [phone] ? ?[place] - Preparing for Surgery ?Before surgery, you can play an important role.  Because skin is not sterile, your skin needs to be as free of germs as possible.  You can reduce the number of germs on your skin by washing with CHG (chlorahexidine gluconate) soap before surgery.  CHG is an antiseptic cleaner which kills germs and bonds with the skin to continue killing germs even after washing. ?Please DO NOT use if you have an allergy to CHG or antibacterial soaps.  If your skin becomes reddened/irritated stop using the CHG and inform your nurse when you arrive at Short Stay. ?Do not shave (including legs and underarms) for at least 48 hours prior to the first CHG shower.  You may shave your face/neck. ? ?Please follow these instructions carefully: ? 1.  Shower with CHG Soap the night before surgery and the  morning of surgery. ? 2.  If you choose to wash your hair, wash your hair first as usual with your normal  shampoo. ? 3.  After you shampoo, rinse your hair and body thoroughly to remove the shampoo.                            ?  4.  Use CHG as you would any other liquid soap.  You can apply chg directly to the skin and wash.  Gently with a scrungie or clean washcloth. ? 5.  Apply the CHG Soap to your body ONLY FROM THE NECK DOWN.   Do   not use on face/ open      ?                     Wound or open sores. Avoid contact with eyes, ears mouth and   genitals (private parts).  ?                     Production manager,  Genitals (private parts) with your normal soap. ?            6.  Wash thoroughly, paying special attention to the area where your    surgery  will be performed. ? 7.  Thoroughly rinse your body with warm water from the neck down. ? 8.  DO NOT shower/wash with  your normal soap after using and rinsing off the CHG Soap. ?               9.  Pat yourself dry with a clean towel. ?           10.  Wear clean pajamas. ?           11.  Place clean sheets on your bed the night of your first shower and do not  sleep with pets. ?Day of Surgery : ?Do not apply any lotions/deodorants the morning of surgery.  Please wear clean clothes to the hospital/surgery center. ? ?FAILURE TO FOLLOW THESE INSTRUCTIONS MAY RESULT IN THE CANCELLATION OF YOUR SURGERY ? ?PATIENT SIGNATURE_________________________________ ? ?NURSE SIGNATURE__________________________________ ? ?________________________________________________________________________  ?  ?

## 2021-07-05 NOTE — Patient Instructions (Addendum)
DUE TO COVID-19 ONLY TWO VISITORS  (aged 81 and older)  IS ALLOWED TO COME WITH YOU AND STAY IN THE WAITING ROOM ONLY DURING PRE OP AND PROCEDURE.   ?**NO VISITORS ARE ALLOWED IN THE SHORT STAY AREA OR RECOVERY ROOM!!** ? ?IF YOU WILL BE ADMITTED INTO THE HOSPITAL YOU ARE ALLOWED ONLY FOUR SUPPORT PEOPLE DURING VISITATION HOURS ONLY (7 AM -8PM)   ?The support person(s) must pass our screening, gel in and out ?Visitors GUEST BADGE MUST BE WORN VISIBLY  ?One adult visitor may remain with you overnight and MUST be in the room by 8 P.M.  ? ?You are not required to quarantine, however you are required to wear a well-fitted mask when you are out and around people not in your household.  ?Hand Hygiene often ?Do NOT share personal items ?Notify your provider if you are in close contact with someone who has COVID or you develop fever 100.4 or greater, new onset of sneezing, cough, sore throat, shortness of breath or body aches. ? ?     ? Your procedure is scheduled on:  07-12-21 ? ? Report to [facility] Main Entrance ? ?  Report to admitting at 6:45 AM ? ? Call this number if you have problems the morning of surgery [phone] ? ? Do not eat food or drink liquids :After Midnight. ? ?Water ?Black Coffee (sugar ok, NO MILK/CREAM OR CREAMERS)  ?Tea (sugar ok, NO MILK/CREAM OR CREAMERS) regular and decaf                             ?Plain Jell-O (NO RED)                                           ?Fruit ices (not with fruit pulp, NO RED)                                     ?Popsicles (NO RED)                                                                  ?Juice: apple, WHITE grape, WHITE cranberry ?Sports drinks like Gatorade (NO RED) ?Clear broth(vegetable,chicken,beef) ?             ?FOLLOW BOWEL PREP AND ANY ADDITIONAL PRE OP INSTRUCTIONS YOU RECEIVED FROM YOUR SURGEON'S OFFICE!!! ? ?- FLEETS ENEMA MORNING OF SURGERY ?  ?  ?Oral Hygiene is also important to reduce your risk of infection.                                     ?Remember - BRUSH YOUR TEETH THE MORNING OF SURGERY WITH YOUR REGULAR TOOTHPASTE ? ? Do NOT smoke after Midnight ? ?Take these medicines the morning of surgery with A SIP OF WATER:  Tylenol, Amlodipine, Famotidine, Tamsulosin ? ?           You may not have any metal on your body including  jewelry, and body piercing ? ?           Do not wear lotions, powders, cologne, or deodorant ? ?            Men may shave face and neck. ? ? Do not bring valuables to the hospital. [place]. ? ? Contacts, dentures or bridgework may not be worn into surgery. ? ? Bring small overnight bag day of surgery. ?  ?Special Instructions: Bring a copy of your healthcare power of attorney and living will documents         the day of surgery if you haven't scanned them before. ? ?Please read over the following fact sheets you were given: IF [place] ? ?[facility] - Preparing for Surgery ?Before surgery, you can play an important role.  Because skin is not sterile, your skin needs to be as free of germs as possible.  You can reduce the number of germs on your skin by washing with CHG (chlorahexidine gluconate) soap before surgery.  CHG is an antiseptic cleaner which kills germs and bonds with the skin to continue killing germs even after washing. ?Please DO NOT use if you have an allergy to CHG or antibacterial soaps.  If your skin becomes reddened/irritated stop using the CHG and inform your nurse when you arrive at Short Stay. ?Do not shave (including legs and underarms) for at least 48 hours prior to the first CHG shower.  You may shave your face/neck. ? ?Please follow these instructions carefully: ? 1.  Shower with CHG Soap the night before surgery and the  morning of surgery. ? 2.  If you choose to wash your hair, wash your hair first as usual with your normal  shampoo. ? 3.  After you shampoo, rinse your hair and body  thoroughly to remove the shampoo.                            ? 4.  Use CHG as you would any other liquid soap.  You can apply chg directly to the skin and wash.  Gently with a scrungie or clean washcloth. ? 5.  Apply the CHG Soap to your body ONLY FROM THE NECK DOWN.   Do   not use on face/ open      ?                     Wound or open sores. Avoid contact with eyes, ears mouth and   genitals (private parts).  ?                     Production manager,  Genitals (private parts) with your normal soap. ?            6.  Wash thoroughly, paying special attention to the area where your    surgery  will be performed. ? 7.  Thoroughly rinse your body with warm water from the neck down. ? 8.  DO NOT shower/wash with your normal soap after using and rinsing off the CHG Soap. ?               9.  Pat yourself dry with a clean towel. ?           10.  Wear clean pajamas. ?  11.  Place clean sheets on your bed the night of your first shower and do not  sleep with pets. ?Day of Surgery : ?Do not apply any lotions/deodorants the morning of surgery.  Please wear clean clothes to the hospital/surgery center. ? ?FAILURE TO FOLLOW THESE INSTRUCTIONS MAY RESULT IN THE CANCELLATION OF YOUR SURGERY ? ?PATIENT SIGNATURE_________________________________ ? ?NURSE SIGNATURE__________________________________ ? ?________________________________________________________________________  ?  ?

## 2021-07-11 ENCOUNTER — Encounter (HOSPITAL_COMMUNITY)
Admission: RE | Admit: 2021-07-11 | Discharge: 2021-07-11 | Disposition: A | Discharge status: Home / Self Care | Payer: PPO | Source: Ambulatory Visit | Attending: Family Medicine | Admitting: Family Medicine

## 2021-07-11 DIAGNOSIS — I2510 Atherosclerotic heart disease of native coronary artery without angina pectoris: Secondary | ICD-10-CM

## 2021-07-11 DIAGNOSIS — Z01818 Encounter for other preprocedural examination: Secondary | ICD-10-CM

## 2021-07-11 NOTE — Anesthesia Preprocedure Evaluation (Addendum)
Anesthesia Evaluation  ?Patient identified by MRN, date of birth, ID band ?Patient awake ? ? ? ?Reviewed: ?Allergy & Precautions, NPO status , Patient's Chart, lab work & pertinent test results ? ?Airway ?Mallampati: II ? ?TM Distance: >3 FB ?Neck ROM: Full ? ? ? Dental ?no notable dental hx. ?(+) Dental Advisory Given ?  ?Pulmonary ?neg pulmonary ROS, former smoker,  ?  ?Pulmonary exam normal ? ? ? ? ? ? ? Cardiovascular ?hypertension, Pt. on medications ? ?Rhythm:Regular Rate:Normal ?+ Systolic murmurs ? ?  ?Neuro/Psych ?negative neurological ROS ?   ? GI/Hepatic ?Neg liver ROS, GERD  Medicated,  ?Endo/Other  ?negative endocrine ROS ? Renal/GU ?  ? ?  ?Musculoskeletal ?negative musculoskeletal ROS ?(+)  ? Abdominal ?  ?Peds ? Hematology ?negative hematology ROS ?(+)   ?Anesthesia Other Findings ? ? Reproductive/Obstetrics ? ?  ? ? ? ? ? ? ? ? ? ? ? ? ? ?  ?  ? ? ? ? ? ? ? ?Anesthesia Physical ?Anesthesia Plan ? ?ASA: 2 ? ?Anesthesia Plan: General  ? ?Post-op Pain Management: Celebrex PO (pre-op)* and Tylenol PO (pre-op)*  ? ?Induction: Intravenous ? ?PONV Risk Score and Plan: 3 and Ondansetron, Dexamethasone and Midazolam ? ?Airway Management Planned: LMA ? ?Additional Equipment:  ? ?Intra-op Plan:  ? ?Post-operative Plan: Extubation in OR ? ?Informed Consent: I have reviewed the patients History and Physical, chart, labs and discussed the procedure including the risks, benefits and alternatives for the proposed anesthesia with the patient or authorized representative who has indicated his/her understanding and acceptance.  ? ? ? ?Dental advisory given ? ?Plan Discussed with: Anesthesiologist and CRNA ? ?Anesthesia Plan Comments:   ? ? ? ? ? ?Anesthesia Quick Evaluation ? ?

## 2021-07-11 NOTE — H&P (Signed)
1. ED  ?2. Elevated PSA/prostate nodule  ?3. LUTs/urgency  ? ?05/12/19: 81 year old man referred for multiple urologic problems including ED, elevated PSA, and lower urinary tract symptoms specifically urinary urgency and nocturia x3. The patient has been on Flomax for years and does not feel like it's helping him very much. He has also tried medication for ED in the past but is unsure if he took it correctly and what dosages that he tried. He has had problems with erections for 5+ years and no longer gets any spontaneous erections. He denies a family history of prostate cancer, gross hematuria, or UTIs. Most recent PSA checked on 04/25/2019 was 4.63. He reports stopping and starting, straining to urinate, nocturia, urgency and frequency.  ? ?11/10/20: Mr Kimberley returns today in f/u for progressive LUTS. he remains on tamsulosin and finasteride. His IPSS is 16. His PVR is 14m. His PF is 8.745msec with 8710moided. He has nocturia 3-5x and frequency q15m28m2hr. He has urgency. He has some UUI. He has no hematuria or dysuria. He has a history of an elevated PSA with the last in 1/21 4.63. He has a history of a prostate nodule as well. He has ED and was given sildenafil without benefit. He had a UA last year that had 20-40 RBC's but is clear today. He has failed Myrbetriq.  ? ?06/27/21: GlenT[place]urns today in f/u. He remains on finasteride and tamsulosin 0.'4mg'$  2 daily. His IPSS is 23ml52mth urgency. His PSA in 8/22 was 5.86 with a 33% f/t ratio. His UA is clear. His prostate was about 62ml 79m CT in 2018.  ? ?  ?AUA Symptom Score: 50% of the time he has the sensation of not emptying his bladder completely when finished urinating. 50% of the time he has to urinate again fewer than two hours after he has finished urinating. 50% of the time he has to start and stop again several times when he urinates. Almost always he finds it difficult to postpone urination. 50% of the time he has a weak urinary stream. 50% of the time  he has to push or strain to begin urination. He has to get up to urinate 3 times from the time he goes to bed until the time he gets up in the morning.  ? ?Calculated AUA Symptom Score: 23 ? ?  ?ALLERGIES: Cat Dander ?No Known Drug Allergies ?[place]  ?  ? ?MEDICATIONS: Aspirin 81 mg tablet,chewable  ?Finasteride 5 mg tablet 1 tablet PO Daily  ?Tamsulosin Hcl 0.4 mg capsule 2 capsule PO Daily  ?Amlodipine Besylate 10 mg tablet  ?  ? ?GU PSH: Complex Uroflow - 11/10/2020 ?Cystoscopy - 11/10/2020 ? ?  ? ?NON-GU PSH: None  ? ?GU PMH: Elevated PSA, I will repeat a PSA today and if it is further elevated I will push for a prostate US and[place]ore[place]opsy. If it is back down, we will see how he does with the doubled tamsulosin but I may still want to consider a prostate US andKoreaiopsy. - 11/10/2020, I discussed patient's PSA was actually in the age specific range for the patient and that we typically stop taking PSAs around age 47 bec34se even if we found prostate cancer often times the risk benefit ratio in source not treating. However now that we know he has elevated PSA and I felt a nodule on DRE we discussed proceeding with prostate biopsy versus repeating PSA in bout 6 months. Patient was in favor repeating PSA., -  2021 ?Nocturia - 11/10/2020, Urinary symptoms are significantly impacting his sleep. He is on Flomax which is continue but also gave him 25 mg of Myrbetriq to try. If he likes medication I will send a prescription to the pharmacy. We do over to up to 50 mg. 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 We talked about interventions such as TURP and rezum. , - 2021 ?Prostate nodule w/ LUTS, He has progressive LUTS with a right prostate nodule and a history of an elevated PSA on finasteride. I discussed options for therapy including doubling tamulsosin or changing the alpha blocker, Urolift, Rezum and TURP. I am going to have him double the tamsulosin for  now but think he will be best served with a TURP along with a prostate biopsy to r/o prostate cancer. I reviewd the risks of a TURP including bleeding, infection, incontinence, stricture, need for secondary procedures, ejaculatory and erectile dysfunction, thrombotic events, fluid overload and anesthetic complications. I explained that 95% of men will have relief of the obstructive symptoms and about 70% will have relief of the irritative symptoms. - 11/10/2020, A see above, - 2021 ?Urinary Frequency - 11/10/2020 ?Urinary Urgency - 11/10/2020, - 2021 ?ED due to arterial insufficiency, We discussed different intervention for erectile dysfunction and patient is interested in trying 100 mg of sildenafil. I described how it works the best and about possible side effects. He will let me know how it goes. - 2021 ?  ? ?NON-GU PMH: Arthritis ?Hypercholesterolemia ?Hypertension ?  ? ?FAMILY HISTORY: No Family History   ? ?SOCIAL HISTORY: Marital Status: Married ?Preferred Language: [place]; Ethnicity: Not Hispanic Or Latino; Race: White ?Current Smoking Status: Patient does not smoke anymore. Has not smoked since 04/28/1979.  ? ?Tobacco Use Assessment Completed: Used Tobacco in last 30 days? ?Social Drinker.  ?Drinks 1 caffeinated drink per day. ?  ? ?REVIEW OF SYSTEMS:    ?GU Review Male:   Patient reports frequent urination, get up at night to urinate, leakage of urine, and trouble starting your stream. Patient denies hard to postpone urination, burning/ pain with urination, stream starts and stops, have to strain to urinate , erection problems, and penile pain.  ?Gastrointestinal (Upper):   Patient denies nausea, vomiting, and indigestion/ heartburn.  ?Gastrointestinal (Lower):   with some fecal urgency.  ?Patient reports constipation. Patient denies diarrhea.  ?Constitutional:   Patient denies fever, night sweats, weight loss, and fatigue.  ?Skin:   Patient denies skin rash/ lesion and itching.  ?Eyes:   Patient denies  blurred vision and double vision.  ?Ears/ Nose/ Throat:   Patient denies sore throat and sinus problems.  ?Hematologic/Lymphatic:   Patient denies swollen glands and easy bruising.  ?Cardiovascular:   Patient denies leg swelling and chest pains.  ?Respiratory:   Patient denies cough and shortness of breath.  ?Endocrine:   Patient denies excessive thirst.  ?Musculoskeletal:   Patient denies back pain and joint pain.  ?Neurological:   Patient denies headaches and dizziness.  ?Psychologic:   Patient denies depression and anxiety.  ? ?Notes: Urgency ?  ? ?VITAL SIGNS: None  ? ?MULTI-SYSTEM PHYSICAL EXAMINATION:    ?Constitutional: Well-nourished. No physical deformities. Normally developed. Good grooming.  ?Respiratory: Normal breath sounds. No labored breathing, no use of accessory muscles.   ?Cardiovascular: Regular rate and rhythm. No murmur, no gallop.   ? ?  ?Complexity of Data:  ?Lab Test Review:   PSA, Free PSA  ?Records Review:  AUA Symptom Score, Previous Patient Records  ?Urine Test Review:   Urinalysis  ? 11/10/20  ?PSA  ?Total PSA 5.86 ng/mL  ?Free PSA 1.91 ng/mL  ?% Free PSA 33 % PSA  ? ? ?PROCEDURES:    ? ?     Urinalysis ?Dipstick Dipstick Cont'd  ?Color: Yellow Bilirubin: Neg mg/dL  ?Appearance: Clear Ketones: Neg mg/dL  ?Specific Gravity: 1.025 Blood: Neg ery/uL  ?pH: 5.5 Protein: Trace mg/dL  ?Glucose: Neg mg/dL Urobilinogen: 1.0 mg/dL  ?  Nitrites: Neg  ?  Leukocyte Esterase: Neg leu/uL  ? ? ?ASSESSMENT:  ?    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 We discuss options for treatment including a TURP, Rezum and Urolift and I have recommended a TURP with a prostate [place] and biopsy. I have reviewed the risks in detail. I reviewd the risks of a TURP including bleeding, infection, incontinence, stricture, need for secondary procedures, ejaculatory and erectile dysfunction, thrombotic events, fluid  overload and anesthetic complications. I explained that 95% of men will have relief of the obstructive symptoms and about 70% will have relief of the irritative symptoms.  ?  ?2   Elevated PSA - R97.20 Chronic, Wor

## 2021-07-12 ENCOUNTER — Other Ambulatory Visit: Payer: Self-pay

## 2021-07-12 ENCOUNTER — Encounter (HOSPITAL_COMMUNITY)
Admission: RE | Disposition: A | Discharge status: Home / Self Care | Payer: Self-pay | Source: Home / Self Care | Attending: Urology

## 2021-07-12 ENCOUNTER — Ambulatory Visit (HOSPITAL_COMMUNITY): Payer: PPO | Admitting: Anesthesiology

## 2021-07-12 ENCOUNTER — Observation Stay (HOSPITAL_COMMUNITY)
Admission: RE | Admit: 2021-07-12 | Discharge: 2021-07-13 | Disposition: A | Discharge status: Home / Self Care | Payer: PPO | Attending: Urology | Admitting: Urology

## 2021-07-12 ENCOUNTER — Ambulatory Visit (HOSPITAL_BASED_OUTPATIENT_CLINIC_OR_DEPARTMENT_OTHER): Payer: PPO | Admitting: Anesthesiology

## 2021-07-12 ENCOUNTER — Ambulatory Visit (HOSPITAL_COMMUNITY)
Admission: RE | Admit: 2021-07-12 | Discharge: 2021-07-12 | Disposition: A | Discharge status: Home / Self Care | Payer: PPO | Source: Ambulatory Visit | Attending: Urology | Admitting: Urology

## 2021-07-12 ENCOUNTER — Encounter (HOSPITAL_COMMUNITY): Payer: Self-pay | Admitting: Urology

## 2021-07-12 DIAGNOSIS — C61 Malignant neoplasm of prostate: Secondary | ICD-10-CM | POA: Insufficient documentation

## 2021-07-12 DIAGNOSIS — Z7982 Long term (current) use of aspirin: Secondary | ICD-10-CM | POA: Diagnosis not present

## 2021-07-12 DIAGNOSIS — N138 Other obstructive and reflux uropathy: Secondary | ICD-10-CM

## 2021-07-12 DIAGNOSIS — R972 Elevated prostate specific antigen [PSA]: Secondary | ICD-10-CM | POA: Diagnosis not present

## 2021-07-12 DIAGNOSIS — N402 Nodular prostate without lower urinary tract symptoms: Secondary | ICD-10-CM

## 2021-07-12 DIAGNOSIS — Z87891 Personal history of nicotine dependence: Secondary | ICD-10-CM | POA: Diagnosis not present

## 2021-07-12 DIAGNOSIS — R351 Nocturia: Secondary | ICD-10-CM | POA: Diagnosis not present

## 2021-07-12 DIAGNOSIS — N401 Enlarged prostate with lower urinary tract symptoms: Secondary | ICD-10-CM | POA: Insufficient documentation

## 2021-07-12 DIAGNOSIS — N320 Bladder-neck obstruction: Secondary | ICD-10-CM | POA: Diagnosis not present

## 2021-07-12 DIAGNOSIS — R350 Frequency of micturition: Secondary | ICD-10-CM | POA: Insufficient documentation

## 2021-07-12 DIAGNOSIS — Z85828 Personal history of other malignant neoplasm of skin: Secondary | ICD-10-CM | POA: Diagnosis not present

## 2021-07-12 DIAGNOSIS — N400 Enlarged prostate without lower urinary tract symptoms: Secondary | ICD-10-CM | POA: Diagnosis not present

## 2021-07-12 DIAGNOSIS — R066 Hiccough: Secondary | ICD-10-CM | POA: Diagnosis not present

## 2021-07-12 DIAGNOSIS — Z79899 Other long term (current) drug therapy: Secondary | ICD-10-CM | POA: Diagnosis not present

## 2021-07-12 DIAGNOSIS — R3915 Urgency of urination: Secondary | ICD-10-CM | POA: Insufficient documentation

## 2021-07-12 DIAGNOSIS — I10 Essential (primary) hypertension: Secondary | ICD-10-CM | POA: Insufficient documentation

## 2021-07-12 HISTORY — PX: PROSTATE BIOPSY: SHX241

## 2021-07-12 HISTORY — PX: TRANSURETHRAL RESECTION OF PROSTATE: SHX73

## 2021-07-12 SURGERY — TURP (TRANSURETHRAL RESECTION OF PROSTATE)
Anesthesia: General | Site: Urethra

## 2021-07-12 MED ORDER — FENTANYL CITRATE (PF) 100 MCG/2ML IJ SOLN
INTRAMUSCULAR | Status: AC
  Filled 2021-07-12: qty 2

## 2021-07-12 MED ORDER — AMISULPRIDE (ANTIEMETIC) 5 MG/2ML IV SOLN: 10.0000 mg | Freq: Once | INTRAVENOUS | Status: DC | PRN

## 2021-07-12 MED ORDER — POLYETHYLENE GLYCOL 3350 17 G PO PACK
17.0000 g | PACK | Freq: Every day | ORAL | Status: DC
  Administered 2021-07-13: 17 g via ORAL
  Filled 2021-07-12: qty 1

## 2021-07-12 MED ORDER — OXYCODONE HCL 5 MG PO TABS: 5.0000 mg | ORAL_TABLET | ORAL | Status: DC | PRN

## 2021-07-12 MED ORDER — DIPHENHYDRAMINE HCL 12.5 MG/5ML PO ELIX
12.5000 mg | ORAL_SOLUTION | Freq: Four times a day (QID) | ORAL | Status: DC | PRN
Start: 2021-07-12 — End: 2021-07-13

## 2021-07-12 MED ORDER — CELECOXIB 200 MG PO CAPS
200.0000 mg | ORAL_CAPSULE | Freq: Once | ORAL | Status: AC
  Administered 2021-07-12: 200 mg via ORAL
  Filled 2021-07-12: qty 1

## 2021-07-12 MED ORDER — SODIUM CHLORIDE 0.9 % IV SOLN
1.0000 g | INTRAVENOUS | Status: DC
  Administered 2021-07-13: 1 g via INTRAVENOUS
  Filled 2021-07-12: qty 10

## 2021-07-12 MED ORDER — GENTAMICIN SULFATE 40 MG/ML IJ SOLN
5.0000 mg/kg | INTRAVENOUS | Status: AC
  Administered 2021-07-12: 390 mg via INTRAVENOUS
  Filled 2021-07-12: qty 9.75

## 2021-07-12 MED ORDER — ZOLPIDEM TARTRATE 5 MG PO TABS: 5.0000 mg | ORAL_TABLET | Freq: Every evening | ORAL | Status: DC | PRN

## 2021-07-12 MED ORDER — FENTANYL CITRATE PF 50 MCG/ML IJ SOSY
PREFILLED_SYRINGE | INTRAMUSCULAR | Status: AC
  Filled 2021-07-12: qty 2

## 2021-07-12 MED ORDER — POTASSIUM CHLORIDE IN NACL 20-0.45 MEQ/L-% IV SOLN
INTRAVENOUS | Status: DC
  Filled 2021-07-12 (×3): qty 1000

## 2021-07-12 MED ORDER — FLEET ENEMA 7-19 GM/118ML RE ENEM: 1.0000 | ENEMA | Freq: Once | RECTAL | Status: DC | PRN

## 2021-07-12 MED ORDER — ORAL CARE MOUTH RINSE
15.0000 mL | Freq: Once | OROMUCOSAL | Status: AC
  Administered 2021-07-12: 15 mL via OROMUCOSAL

## 2021-07-12 MED ORDER — BISACODYL 10 MG RE SUPP: 10.0000 mg | Freq: Every day | RECTAL | Status: DC | PRN

## 2021-07-12 MED ORDER — PROPOFOL 10 MG/ML IV BOLUS
INTRAVENOUS | Status: DC | PRN
  Administered 2021-07-12: 140 mg via INTRAVENOUS

## 2021-07-12 MED ORDER — HYDROMORPHONE HCL 1 MG/ML IJ SOLN: 0.5000 mg | INTRAMUSCULAR | Status: DC | PRN

## 2021-07-12 MED ORDER — ACETAMINOPHEN 325 MG PO TABS: 650.0000 mg | ORAL_TABLET | ORAL | Status: DC | PRN

## 2021-07-12 MED ORDER — SODIUM CHLORIDE 0.9 % IR SOLN
3000.0000 mL | Status: DC
  Administered 2021-07-12 (×2): 3000 mL

## 2021-07-12 MED ORDER — PROPOFOL 10 MG/ML IV BOLUS
INTRAVENOUS | Status: AC
  Filled 2021-07-12: qty 20

## 2021-07-12 MED ORDER — GLYCOPYRROLATE 0.2 MG/ML IJ SOLN
INTRAMUSCULAR | Status: DC | PRN
  Administered 2021-07-12: .2 mg via INTRAVENOUS

## 2021-07-12 MED ORDER — FLEET ENEMA 7-19 GM/118ML RE ENEM
1.0000 | ENEMA | Freq: Once | RECTAL | Status: DC
  Filled 2021-07-12: qty 1

## 2021-07-12 MED ORDER — SODIUM CHLORIDE 0.9 % IR SOLN
Status: DC | PRN
  Administered 2021-07-12: 18000 mL via INTRAVESICAL

## 2021-07-12 MED ORDER — SODIUM CHLORIDE 0.9 % IV SOLN
2.0000 g | INTRAVENOUS | Status: AC
  Administered 2021-07-12: 2 g via INTRAVENOUS
  Filled 2021-07-12: qty 20

## 2021-07-12 MED ORDER — FAMOTIDINE 20 MG PO TABS
20.0000 mg | ORAL_TABLET | Freq: Every day | ORAL | Status: DC
  Administered 2021-07-13: 20 mg via ORAL
  Filled 2021-07-12: qty 1

## 2021-07-12 MED ORDER — ONDANSETRON HCL 4 MG/2ML IJ SOLN
4.0000 mg | INTRAMUSCULAR | Status: DC | PRN
  Administered 2021-07-13: 4 mg via INTRAVENOUS
  Filled 2021-07-12: qty 2

## 2021-07-12 MED ORDER — HYOSCYAMINE SULFATE 0.125 MG SL SUBL
0.1250 mg | SUBLINGUAL_TABLET | SUBLINGUAL | Status: DC | PRN
  Filled 2021-07-12: qty 1

## 2021-07-12 MED ORDER — AMLODIPINE BESYLATE 10 MG PO TABS
10.0000 mg | ORAL_TABLET | Freq: Every day | ORAL | Status: DC
  Administered 2021-07-12 – 2021-07-13 (×2): 10 mg via ORAL
  Filled 2021-07-12 (×2): qty 1

## 2021-07-12 MED ORDER — FENTANYL CITRATE PF 50 MCG/ML IJ SOSY
25.0000 ug | PREFILLED_SYRINGE | INTRAMUSCULAR | Status: DC | PRN
  Administered 2021-07-12: 50 ug via INTRAVENOUS

## 2021-07-12 MED ORDER — ONDANSETRON HCL 4 MG/2ML IJ SOLN
INTRAMUSCULAR | Status: DC | PRN
  Administered 2021-07-12: 4 mg via INTRAVENOUS

## 2021-07-12 MED ORDER — FENTANYL CITRATE (PF) 100 MCG/2ML IJ SOLN
INTRAMUSCULAR | Status: DC | PRN
Start: 2021-07-12 — End: 2021-07-12
  Administered 2021-07-12 (×2): 50 ug via INTRAVENOUS

## 2021-07-12 MED ORDER — LACTATED RINGERS IV SOLN: INTRAVENOUS | Status: DC

## 2021-07-12 MED ORDER — SENNOSIDES-DOCUSATE SODIUM 8.6-50 MG PO TABS: 1.0000 | ORAL_TABLET | Freq: Every evening | ORAL | Status: DC | PRN

## 2021-07-12 MED ORDER — ACETAMINOPHEN 500 MG PO TABS
1000.0000 mg | ORAL_TABLET | Freq: Once | ORAL | Status: AC
  Administered 2021-07-12: 1000 mg via ORAL
  Filled 2021-07-12: qty 2

## 2021-07-12 MED ORDER — LIDOCAINE 2% (20 MG/ML) 5 ML SYRINGE
INTRAMUSCULAR | Status: DC | PRN
  Administered 2021-07-12: 100 mg via INTRAVENOUS

## 2021-07-12 MED ORDER — CHLORHEXIDINE GLUCONATE 0.12 % MT SOLN: 15.0000 mL | Freq: Once | OROMUCOSAL | Status: AC

## 2021-07-12 MED ORDER — METRONIDAZOLE 0.75 % EX GEL: 1.0000 "application " | Freq: Every day | CUTANEOUS | Status: DC | PRN

## 2021-07-12 MED ORDER — 0.9 % SODIUM CHLORIDE (POUR BTL) OPTIME
TOPICAL | Status: DC | PRN
  Administered 2021-07-12: 1000 mL

## 2021-07-12 MED ORDER — LIDOCAINE HCL URETHRAL/MUCOSAL 2 % EX GEL
CUTANEOUS | Status: AC
  Filled 2021-07-12: qty 30

## 2021-07-12 MED ORDER — DIPHENHYDRAMINE HCL 50 MG/ML IJ SOLN: 12.5000 mg | Freq: Four times a day (QID) | INTRAMUSCULAR | Status: DC | PRN

## 2021-07-12 MED ORDER — DEXAMETHASONE SODIUM PHOSPHATE 10 MG/ML IJ SOLN
INTRAMUSCULAR | Status: DC | PRN
  Administered 2021-07-12: 10 mg via INTRAVENOUS

## 2021-07-12 SURGICAL SUPPLY — 38 items
BAG COUNTER SPONGE SURGICOUNT (BAG) IMPLANT
BAG URINE DRAIN 2000ML AR STRL (UROLOGICAL SUPPLIES) ×1 IMPLANT
BAG URO CATCHER STRL LF (MISCELLANEOUS) ×3 IMPLANT
CATH FOLEY 3WAY 30CC 22FR (CATHETERS) ×1 IMPLANT
DRAPE FOOT SWITCH (DRAPES) ×3 IMPLANT
DRSG TELFA 3X8 NADH (GAUZE/BANDAGES/DRESSINGS) ×3 IMPLANT
ELECT REM PT RETURN 15FT ADLT (MISCELLANEOUS) ×3 IMPLANT
GLOVE BIOGEL PI IND STRL 6.5 (GLOVE) IMPLANT
GLOVE BIOGEL PI IND STRL 7.0 (GLOVE) IMPLANT
GLOVE BIOGEL PI INDICATOR 6.5 (GLOVE) ×1
GLOVE BIOGEL PI INDICATOR 7.0 (GLOVE) ×1
GLOVE SURG SS PI 8.0 STRL IVOR (GLOVE) ×1 IMPLANT
GOWN STRL REUS W/ TWL XL LVL3 (GOWN DISPOSABLE) ×2 IMPLANT
GOWN STRL REUS W/TWL LRG LVL3 (GOWN DISPOSABLE) ×2 IMPLANT
GOWN STRL REUS W/TWL XL LVL3 (GOWN DISPOSABLE) ×1
HOLDER FOLEY CATH W/STRAP (MISCELLANEOUS) ×1 IMPLANT
INST BIOPSY MAXCORE 18GX25 (NEEDLE) IMPLANT
INSTR BIOPSY MAXCORE 18GX20 (NEEDLE) ×1 IMPLANT
KIT TURNOVER KIT A (KITS) ×1 IMPLANT
LEGGING LITHOTOMY PAIR STRL (DRAPES) IMPLANT
LOOP CUT BIPOLAR 24F LRG (ELECTROSURGICAL) IMPLANT
MANIFOLD NEPTUNE II (INSTRUMENTS) ×3 IMPLANT
NDL SAFETY ECLIPSE 18X1.5 (NEEDLE) IMPLANT
NDL SPNL 22GX7 QUINCKE BK (NEEDLE) IMPLANT
NEEDLE HYPO 18GX1.5 SHARP (NEEDLE)
NEEDLE SPNL 22GX7 QUINCKE BK (NEEDLE) IMPLANT
PACK CYSTO (CUSTOM PROCEDURE TRAY) ×3 IMPLANT
PAD DRESSING TELFA 3X8 NADH (GAUZE/BANDAGES/DRESSINGS) ×2 IMPLANT
PENCIL SMOKE EVACUATOR (MISCELLANEOUS) IMPLANT
SURGILUBE 2OZ TUBE FLIPTOP (MISCELLANEOUS) ×3 IMPLANT
SYR 30ML LL (SYRINGE) ×1 IMPLANT
SYR CONTROL 10ML LL (SYRINGE) IMPLANT
SYR TOOMEY IRRIG 70ML (MISCELLANEOUS)
SYRINGE TOOMEY IRRIG 70ML (MISCELLANEOUS) IMPLANT
TOWEL OR 17X26 10 PK STRL BLUE (TOWEL DISPOSABLE) ×3 IMPLANT
TUBING CONNECTING 10 (TUBING) ×3 IMPLANT
TUBING UROLOGY SET (TUBING) ×3 IMPLANT
UNDERPAD 30X36 HEAVY ABSORB (UNDERPADS AND DIAPERS) ×3 IMPLANT

## 2021-07-12 NOTE — Transfer of Care (Signed)
Immediate Anesthesia Transfer of Care Note ? ?Patient: Jared Shelton. ? ?Procedure(s) Performed: TRANSURETHRAL RESECTION OF THE PROSTATE (TURP) (Urethra) ?BIOPSY TRANSRECTAL ULTRASONIC PROSTATE (TUBP) (Rectum) ? ?Patient Location: PACU ? ?Anesthesia Type:General ? ?Level of Consciousness: sedated, patient cooperative and responds to stimulation ? ?Airway & Oxygen Therapy: Patient Spontanous Breathing and Patient connected to face mask oxygen ? ?Post-op Assessment: Report given to RN and Post -op Vital signs reviewed and stable ? ?Post vital signs: Reviewed and stable ? ?Last Vitals:  ?Vitals Value Taken Time  ?BP 146/92 07/12/21 1033  ?Temp    ?Pulse 64 07/12/21 1034  ?Resp 13 07/12/21 1034  ?SpO2 99 % 07/12/21 1034  ?Vitals shown include unvalidated device data. ? ?Last Pain:  ?Vitals:  ? 07/12/21 0641  ?TempSrc: Oral  ?   ? ?  ? ?Complications: No notable events documented. ?

## 2021-07-12 NOTE — Interval H&P Note (Signed)
History and Physical Interval Note: ? ?07/12/2021 ?8:46 AM ? ?Jared Shelton.  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  The various methods of treatment have been discussed with the patient and family. After consideration of risks, benefits and other options for treatment, the patient has consented to  Procedure(s) with comments: ?TRANSURETHRAL RESECTION OF THE PROSTATE (TURP) (N/A) - 75 MINS FOR CASE ?BIOPSY TRANSRECTAL ULTRASONIC PROSTATE (TUBP) (N/A) - 75 MINS FOR CASE as a surgical intervention.  The patient's history has been reviewed, patient examined, no change in status, stable for surgery.  I have reviewed the patient's chart and labs.  Questions were answered to the patient's satisfaction.   ? ? ?Irine Seal ? ? ?

## 2021-07-12 NOTE — Op Note (Signed)
Procedure: 1.  Transrectal ultrasound of the prostate. ?2.  Ultrasound-guided prostate biopsy. ?3.  Transurethral resection of the prostate. ? ?Preop diagnosis: Elevated PSA with prostate nodule and bladder outlet obstruction. ? ?Postop diagnosis: Same. ? ?Surgeon: Dr. Irine Seal. ? ?Anesthesia: General. ? ?Specimen: 12 core prostate biopsy specimens and TUR chips. ? ?Drains: 22 French three-way Foley catheter. ? ?EBL: 50 mL. ? ?Complications: None. ? 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  He has a history of elevated PSA and a prostate nodule so it was felt that a prostate biopsy should be performed at the time of the TURP. ? ?Procedure: He was taken operating room where he was given Rocephin and gentamicin.  A general anesthetic was induced.  He was placed in the lithotomy position and fitted with PAS hose. ? ?The 10 MHz transrectal ultrasound probe was prepared and inserted transrectally.  The ultrasound demonstrated a 55 mL prostate with normal-appearing seminal vesicles with exception of some mild calcification.  There was slight hypoechogenicity of the left posterior lateral peripheral zone in the mid and base of the prostate and a hypoechoic nodule in the right mid medial aspect of the prostate posteriorly approximately 5 mm in size.  A few prostatic calcifications were noted as well.  There is no significant middle lobe. ? ?After completion the diagnostic scan 12 needle biopsies were obtained in the standard configuration but care was taken to include the hypoechoic areas as described above in the biopsy sampling. ? ?After completion of biopsies the probe was removed and minimal bleeding was noted. ? ?He was then prepped with Betadine solution and draped in usual sterile fashion.  Cystoscopy was performed with a 21 [place] scope and 30 degree lens.  Examination revealed a normal urethra.   The external sphincter was intact.  The prostatic urethra had bilobar hyperplasia with coaptation and obstruction but no significant middle lobe.  The bladder wall had mild trabeculation without tumors, stones or inflammation.  Ureteral orifices were unremarkable. ? ?The urethra was then calibrated to 10 [place] with male sounds and the 65 French continuous-flow resectoscope sheath was passed with the aid of visual obturator.  The visual obturator was removed and replaced with an Beatrix Fetters handle with a bipolar loop and a 30 degree lens.  Saline was used the irrigant. ? ?Resection was initiated at the bladder neck where the fibers were exposed from 5-7 o'clock.  The floor of the prostate was then resected out to alongside the verumontanum.  The left lobe of the prostate was resected from bladder neck to apex out to the capsular fibers.  The right lobe of the prostate was resected from bladder neck to apex out the capsular fibers.  Initial hemostasis and chip removal was performed and then residual apical and anterior tissue was resected to complete the resection.  Final hemostasis was achieved and the bladder was evacuated free of chips.  Final inspection demonstrated no retained tissue and no active bleeding.  The ureteral orifices were intact.  There was some minor undermining at the bladder neck.  The scope was removed and pressure on the bladder produced an excellent stream.  A 22 French three-way Foley catheter was placed with the aid of a catheter guide.  The balloon was filled with 30 mL of sterile fluid.  The catheter was then irrigated with clear return and placed on continuous irrigation and straight drainage.  He was taken down from lithotomy position, his anesthetic was reversed and he was moved to recovery in stable condition.  There were no complications. ?

## 2021-07-12 NOTE — Anesthesia Procedure Notes (Signed)
Procedure Name: LMA Insertion ?Date/Time: 07/12/2021 9:22 AM ?Performed by: Gean Maidens, CRNA ?Pre-anesthesia Checklist: Patient identified, Emergency Drugs available, Suction available, Patient being monitored and Timeout performed ?Patient Re-evaluated:Patient Re-evaluated prior to induction ?Oxygen Delivery Method: Circle system utilized ?Preoxygenation: Pre-oxygenation with 100% oxygen ?Induction Type: IV induction ?Ventilation: Mask ventilation without difficulty ?LMA: LMA inserted ?LMA Size: 5.0 ?Number of attempts: 1 ?Tube secured with: Tape ?Dental Injury: Teeth and Oropharynx as per pre-operative assessment  ? ? ? ? ?

## 2021-07-12 NOTE — Anesthesia Postprocedure Evaluation (Signed)
Anesthesia Post Note ? ?Patient: Jared Shelton. ? ?Procedure(s) Performed: TRANSURETHRAL RESECTION OF THE PROSTATE (TURP) (Urethra) ?BIOPSY TRANSRECTAL ULTRASONIC PROSTATE (TUBP) (Rectum) ? ?  ? ?Patient location during evaluation: PACU ?Anesthesia Type: General ?Level of consciousness: sedated ?Pain management: pain level controlled ?Vital Signs Assessment: post-procedure vital signs reviewed and stable ?Respiratory status: spontaneous breathing and respiratory function stable ?Cardiovascular status: stable ?Postop Assessment: no apparent nausea or vomiting ?Anesthetic complications: no ? ? ?No notable events documented. ? ?Last Vitals:  ?Vitals:  ? 07/12/21 1100 07/12/21 1115  ?BP: 139/72 139/69  ?Pulse: 71 (!) 55  ?Resp: 18 12  ?Temp:    ?SpO2: 98% 92%  ?  ?Last Pain:  ?Vitals:  ? 07/12/21 1115  ?TempSrc:   ?PainSc: 3   ? ? ?  ?  ?  ?  ?  ?  ? ?Shaheem Pichon DANIEL ? ? ? ? ?

## 2021-07-12 NOTE — Discharge Instructions (Addendum)
You may resume your aspirin and meloxicam in about a week if you are not bleeding.  ?

## 2021-07-13 ENCOUNTER — Encounter (HOSPITAL_COMMUNITY): Payer: Self-pay | Admitting: Urology

## 2021-07-13 DIAGNOSIS — R066 Hiccough: Secondary | ICD-10-CM | POA: Diagnosis not present

## 2021-07-13 DIAGNOSIS — R972 Elevated prostate specific antigen [PSA]: Secondary | ICD-10-CM | POA: Diagnosis present

## 2021-07-13 DIAGNOSIS — N402 Nodular prostate without lower urinary tract symptoms: Secondary | ICD-10-CM | POA: Diagnosis present

## 2021-07-13 DIAGNOSIS — C61 Malignant neoplasm of prostate: Secondary | ICD-10-CM | POA: Diagnosis not present

## 2021-07-13 MED ORDER — SULFAMETHOXAZOLE-TRIMETHOPRIM 800-160 MG PO TABS: 1.0000 | ORAL_TABLET | Freq: Two times a day (BID) | ORAL | 0 refills | Status: DC

## 2021-07-13 MED ORDER — MENTHOL 3 MG MT LOZG
1.0000 | LOZENGE | OROMUCOSAL | Status: DC | PRN
  Administered 2021-07-13: 3 mg via ORAL
  Filled 2021-07-13: qty 9

## 2021-07-13 MED ORDER — PHENOL 1.4 % MT LIQD
1.0000 | OROMUCOSAL | Status: DC | PRN
  Administered 2021-07-13: 1 via OROMUCOSAL
  Filled 2021-07-13: qty 177

## 2021-07-13 NOTE — Progress Notes (Signed)
Notified on call Urology about patient's urine color that is light red, received a verbal order to keep foley in place and clamped CBI.  ?

## 2021-07-13 NOTE — Progress Notes (Addendum)
Pt discharge to home, instructions reviewed with pt and spouse , acknowledged understanding of instructions. ?

## 2021-07-13 NOTE — Progress Notes (Signed)
1 Day Post-Op  ?Subjective: ?Jared Shelton is doing well s/p TURP and prostate biopsy but he has bothersome hiccoughs.  His urine is clear and he is afebrile.  ?ROS: ? ?Review of Systems  ?All other systems reviewed and are negative. ? ?Anti-infectives: ?Anti-infectives (From admission, onward)  ? ? Start     Dose/Rate Route Frequency Ordered Stop  ? 07/13/21 0900  cefTRIAXone (ROCEPHIN) 1 g in sodium chloride 0.9 % 100 mL IVPB       ? 1 g ?200 mL/hr over 30 Minutes Intravenous Every 24 hours 07/12/21 1525 07/20/21 0859  ? 07/12/21 0645  cefTRIAXone (ROCEPHIN) 2 g in sodium chloride 0.9 % 100 mL IVPB       ? 2 g ?200 mL/hr over 30 Minutes Intravenous 30 min pre-op 07/12/21 0633 07/12/21 0922  ? 07/12/21 0645  gentamicin (GARAMYCIN) 390 mg in dextrose 5 % 100 mL IVPB       ? 5 mg/kg ? 77.2 kg (Adjusted) ?109.8 mL/hr over 60 Minutes Intravenous 30 min pre-op 07/12/21 8527 07/12/21 7824  ? ?  ? ? ?Current Facility-Administered Medications  ?Medication Dose Route Frequency Provider Last Rate Last Admin  ? 0.45 % NaCl with KCl 20 mEq / L infusion   Intravenous Continuous Irine Seal, MD 100 mL/hr at 07/13/21 0315 New Bag at 07/13/21 0315  ? acetaminophen (TYLENOL) tablet 650 mg  650 mg Oral Q4H PRN Irine Seal, MD      ? amLODipine (NORVASC) tablet 10 mg  10 mg Oral Daily Irine Seal, MD   10 mg at 07/12/21 1710  ? bisacodyl (DULCOLAX) suppository 10 mg  10 mg Rectal Daily PRN Irine Seal, MD      ? cefTRIAXone (ROCEPHIN) 1 g in sodium chloride 0.9 % 100 mL IVPB  1 g Intravenous Q24H Irine Seal, MD      ? diphenhydrAMINE (BENADRYL) injection 12.5 mg  12.5 mg Intravenous Q6H PRN Irine Seal, MD      ? Or  ? diphenhydrAMINE (BENADRYL) 12.5 MG/5ML elixir 12.5 mg  12.5 mg Oral Q6H PRN Irine Seal, MD      ? famotidine (PEPCID) tablet 20 mg  20 mg Oral Daily Irine Seal, MD      ? HYDROmorphone (DILAUDID) injection 0.5-1 mg  0.5-1 mg Intravenous Q2H PRN Irine Seal, MD      ? hyoscyamine (LEVSIN SL) SL tablet 0.125 mg  0.125 mg  Sublingual Q4H PRN Irine Seal, MD      ? menthol-cetylpyridinium (CEPACOL) lozenge 3 mg  1 lozenge Oral PRN Irine Seal, MD   3 mg at 07/13/21 0543  ? metroNIDAZOLE (METROGEL) 2.35 % gel 1 application.  1 application. Topical Daily PRN Irine Seal, MD      ? ondansetron [facility]) injection 4 mg  4 mg Intravenous Q4H PRN Irine Seal, MD   4 mg at 07/13/21 0334  ? oxyCODONE (Oxy IR/ROXICODONE) immediate release tablet 5 mg  5 mg Oral Q4H PRN Irine Seal, MD      ? phenol (CHLORASEPTIC) mouth spray 1 spray  1 spray Mouth/Throat PRN Irine Seal, MD   1 spray at 07/13/21 0539  ? polyethylene glycol (MIRALAX / GLYCOLAX) packet 17 g  17 g Oral Daily Irine Seal, MD      ? senna-docusate (Senokot-S) tablet 1 tablet  1 tablet Oral QHS PRN Irine Seal, MD      ? sodium chloride irrigation 0.9 % 3,000 mL  3,000 mL Irrigation Continuous Irine Seal, MD   3,000 mL  at 07/12/21 1940  ? sodium phosphate (FLEET) 7-19 GM/118ML enema 1 enema  1 enema Rectal Once PRN Irine Seal, MD      ? zolpidem (AMBIEN) tablet 5 mg  5 mg Oral QHS PRN Irine Seal, MD      ? ? ? ?Objective: ?Vital signs in last 24 hours: ?Temp:  [97.6 ?F (36.4 ?C)-99.1 ?F (37.3 ?C)] 98.2 ?F (36.8 ?C) (04/19 7412) ?Pulse Rate:  [52-89] 89 (04/19 0622) ?Resp:  [12-22] 16 (04/19 0622) ?BP: (127-162)/(66-94) 147/82 (04/19 0622) ?SpO2:  [92 %-98 %] 94 % (04/19 0622) ?Weight:  [91 kg] 91 kg (04/18 1811) ? ?Intake/Output from previous day: ?04/18 0701 - 04/19 0700 ?In: 10250.6 [P.O.:360; I.V.:1880.9; IV Piggyback:209.8] ?Out: 9275 [INOMV:6720] ?Intake/Output this shift: ?No intake/output data recorded. ? ? ?Physical Exam ?Vitals reviewed.  ?Constitutional:   ?   Appearance: Normal appearance.  ?Cardiovascular:  ?   Rate and Rhythm: Normal rate and regular rhythm.  ?   Heart sounds: Normal heart sounds.  ?Pulmonary:  ?   Effort: Pulmonary effort is normal.  ?   Breath sounds: Normal breath sounds.  ?Abdominal:  ?   Palpations: Abdomen is soft.  ?   Tenderness: There is no  abdominal tenderness.  ?Genitourinary: ?   Comments: Urine clear.  ?Neurological:  ?   Mental Status: He is alert.  ? ? ?Lab Results:  ?No results for input(s): WBC, HGB, HCT, PLT in the last 72 hours. ?BMET ?No results for input(s): NA, K, CL, CO2, GLUCOSE, BUN, CREATININE, CALCIUM in the last 72 hours. ?PT/INR ?No results for input(s): LABPROT, INR in the last 72 hours. ?ABG ?No results for input(s): PHART, HCO3 in the last 72 hours. ? ?Invalid input(s): PCO2, PO2 ? ?Studies/Results: ?US Guided Needle Placement ? ?Result Date: 07/12/2021 ?CLINICAL DATA:  Ultrasound was provided for use by the ordering physician.  No provider Interpretation or professional fees incurred.   ? ?[place] Intraoperative ? ?Result Date: 07/12/2021 ?CLINICAL DATA:  Ultrasound was provided for use by the ordering physician.  No provider Interpretation or professional fees incurred.    ? ? ?Assessment and Plan: ?BPH with BOO.  Urine is clear.  I will get the foley out this AM and reassess at mid day. ? ?2.  Hiccoughs.  I will speak to anesthesia to see if they have any suggestions.  ? ?3.  Prostate nodule and elevated PSA.   I will notify him of the path when available.  ? ? ? ? ? LOS: 0 days  ? ? ?Irine Seal ?07/13/2021 ?[phone] Patient ID: Jared Moos., male   DOB: [birth year], 81 y.o.   MRN: [id number] ? ?

## 2021-07-13 NOTE — Discharge Summary (Signed)
Physician Discharge Summary  ?Patient ID: ?Jared Shelton ?MRN: [id number] ?DOB/AGE: 1940/07/16 81 y.o. ? ?Admit date: 07/12/2021 ?Discharge date: 07/13/2021 ? ?Admission Diagnoses:  ?BPH with obstruction/lower urinary tract symptoms ? ?Discharge Diagnoses:  ?Principal Problem: ?  BPH with obstruction/lower urinary tract symptoms ?Active Problems: ?  Elevated PSA ?  Prostate nodule ?  Hiccoughs ? ? ?Past Medical History:  ?Diagnosis Date  ? Arthritis   ? BPH (benign prostatic hyperplasia)   ? Elevated PSA   ? GERD (gastroesophageal reflux disease)   ? Hypertension   ? Skin cancer   ? ? ?Surgeries: Procedure(s): ?TRANSURETHRAL RESECTION OF THE PROSTATE (TURP) ?BIOPSY TRANSRECTAL ULTRASONIC PROSTATE (TUBP) on 07/12/2021 ?  ?Consultants (if any):  ? ?Discharged Condition: Improved ? ?Hospital Course: Jared Pak. is an 81 y.o. male who was admitted 07/12/2021 with a diagnosis of BPH with obstruction/lower urinary tract symptoms and went to the operating room on 07/12/2021 and underwent the above named procedures.  His foley was removed this morning and he is voiding well with light pink urine.  He has bothersome hiccups and I spoke to anesthesia and no specific therapy was recommended.    ? ?He was given perioperative antibiotics:  ?Anti-infectives (From admission, onward)  ? ? Start     Dose/Rate Route Frequency Ordered Stop  ? 07/13/21 0900  cefTRIAXone (ROCEPHIN) 1 g in sodium chloride 0.9 % 100 mL IVPB       ? 1 g ?200 mL/hr over 30 Minutes Intravenous Every 24 hours 07/12/21 1525 07/20/21 0859  ? 07/13/21 0000  sulfamethoxazole-trimethoprim (BACTRIM DS) 800-160 MG tablet       ? 1 tablet Oral 2 times daily 07/13/21 1218    ? 07/12/21 0645  cefTRIAXone (ROCEPHIN) 2 g in sodium chloride 0.9 % 100 mL IVPB       ? 2 g ?200 mL/hr over 30 Minutes Intravenous 30 min pre-op 07/12/21 0633 07/12/21 0922  ? 07/12/21 0645  gentamicin (GARAMYCIN) 390 mg in dextrose 5 % 100 mL IVPB       ? 5 mg/kg ? 77.2 kg  (Adjusted) ?109.8 mL/hr over 60 Minutes Intravenous 30 min pre-op 07/12/21 5993 07/12/21 0922  ? ?  ?. ? ?He was given sequential compression devices for DVT prophylaxis. ? ?He benefited maximally from the hospital stay and there were no complications.   ? ?Recent vital signs:  ?Vitals:  ? 07/13/21 0320 07/13/21 0622  ?BP: (!) 151/76 (!) 147/82  ?Pulse: 62 89  ?Resp: 18 16  ?Temp: 99.1 ?F (37.3 ?C) 98.2 ?F (36.8 ?C)  ?SpO2: 96% 94%  ? ? ?Recent laboratory studies:  ?Lab Results  ?Component Value Date  ? HGB 13.7 07/05/2021  ? HGB 14.3 11/19/2017  ? ?Lab Results  ?Component Value Date  ? WBC 11.0 (H) 07/05/2021  ? PLT 257 07/05/2021  ? ?No results found for: INR ?Lab Results  ?Component Value Date  ? NA 139 07/05/2021  ? K 4.3 07/05/2021  ? CL 107 07/05/2021  ? CO2 25 07/05/2021  ? BUN 25 (H) 07/05/2021  ? CREATININE 1.18 07/05/2021  ? GLUCOSE 113 (H) 07/05/2021  ? ? ?Discharge Medications:   ?Allergies as of 07/13/2021   ?No Known Allergies ?  ? ?  ?Medication List  ?  ? ?TAKE these medications   ? ?acetaminophen 500 MG tablet ?Commonly known as: TYLENOL ?Take 500-1,000 mg by mouth every 6 (six) hours as needed for headache (pain). ?  ?amLODipine 10 MG tablet ?Commonly  known as: NORVASC ?Take 10 mg by mouth daily. ?  ?aspirin EC 81 MG tablet ?Take 81 mg by mouth daily. ?  ?famotidine 20 MG tablet ?Commonly known as: PEPCID ?Take 20 mg by mouth daily. ?  ?meloxicam 15 MG tablet ?Commonly known as: MOBIC ?Take 15 mg by mouth daily. ?  ?metronidazole 1 % cream ?Commonly known as: NORITATE ?Apply 1 application. topically daily as needed (Rash on head). ?  ?polyethylene glycol 17 g packet ?Commonly known as: MIRALAX / GLYCOLAX ?Take 17 g by mouth daily. ?  ?sulfamethoxazole-trimethoprim 800-160 MG tablet ?Commonly known as: BACTRIM DS ?Take 1 tablet by mouth 2 (two) times daily. ?  ? ?  ? ? ?Diagnostic Studies: US Guided Needle Placement ? ?Result Date: 07/12/2021 ?CLINICAL DATA:  Ultrasound was provided for use by the  ordering physician.  No provider Interpretation or professional fees incurred.   ? ?[place] Intraoperative ? ?Result Date: 07/12/2021 ?CLINICAL DATA:  Ultrasound was provided for use by the ordering physician.  No provider Interpretation or professional fees incurred.    ? ?Disposition:  ? ? ? ? Follow-up Information   ? ? Karen Kays, NP Follow up on 07/26/2021.   ?Specialty: Nurse Practitioner ?Why: [id number] ?Contact information: ?[address] ?[place] [address] ?[phone] ? ? ?  ?  ? ?  ?  ? ?  ? ? ? ?Signed: ?Irine Seal ?07/13/2021, 12:19 PM ?  ?

## 2021-07-13 NOTE — Plan of Care (Signed)
  Problem: Education: Goal: Knowledge of General Education information will improve Description Including pain rating scale, medication(s)/side effects and non-pharmacologic comfort measures Outcome: Progressing   

## 2021-07-14 LAB — SURGICAL PATHOLOGY

## 2021-07-18 ENCOUNTER — Other Ambulatory Visit (HOSPITAL_COMMUNITY): Payer: Self-pay | Admitting: Urology

## 2021-07-18 DIAGNOSIS — C61 Malignant neoplasm of prostate: Secondary | ICD-10-CM

## 2021-07-26 DIAGNOSIS — R3915 Urgency of urination: Secondary | ICD-10-CM | POA: Diagnosis not present

## 2021-07-26 DIAGNOSIS — R350 Frequency of micturition: Secondary | ICD-10-CM | POA: Diagnosis not present

## 2021-08-02 ENCOUNTER — Ambulatory Visit (HOSPITAL_COMMUNITY)
Admission: RE | Admit: 2021-08-02 | Discharge: 2021-08-02 | Disposition: A | Discharge status: Home / Self Care | Payer: PPO | Source: Ambulatory Visit | Attending: Urology | Admitting: Urology

## 2021-08-02 DIAGNOSIS — C61 Malignant neoplasm of prostate: Secondary | ICD-10-CM | POA: Insufficient documentation

## 2021-08-02 MED ORDER — PIFLIFOLASTAT F 18 (PYLARIFY) INJECTION
9.0000 | Freq: Once | INTRAVENOUS | Status: AC
  Administered 2021-08-02: 8.51 via INTRAVENOUS

## 2021-08-05 ENCOUNTER — Other Ambulatory Visit (HOSPITAL_COMMUNITY): Payer: Self-pay | Admitting: Urology

## 2021-08-05 ENCOUNTER — Other Ambulatory Visit: Payer: Self-pay | Admitting: Urology

## 2021-08-05 DIAGNOSIS — D430 Neoplasm of uncertain behavior of brain, supratentorial: Secondary | ICD-10-CM

## 2021-08-08 ENCOUNTER — Ambulatory Visit (HOSPITAL_COMMUNITY)
Admission: RE | Admit: 2021-08-08 | Discharge: 2021-08-08 | Disposition: A | Discharge status: Home / Self Care | Payer: PPO | Source: Ambulatory Visit | Attending: Urology | Admitting: Urology

## 2021-08-08 DIAGNOSIS — D430 Neoplasm of uncertain behavior of brain, supratentorial: Secondary | ICD-10-CM | POA: Diagnosis not present

## 2021-08-08 DIAGNOSIS — G9389 Other specified disorders of brain: Secondary | ICD-10-CM | POA: Diagnosis not present

## 2021-08-08 DIAGNOSIS — G936 Cerebral edema: Secondary | ICD-10-CM | POA: Diagnosis not present

## 2021-08-08 DIAGNOSIS — G935 Compression of brain: Secondary | ICD-10-CM | POA: Diagnosis not present

## 2021-08-08 MED ORDER — GADOBUTROL 1 MMOL/ML IV SOLN
9.0000 mL | Freq: Once | INTRAVENOUS | Status: AC | PRN
  Administered 2021-08-08: 9 mL via INTRAVENOUS

## 2021-08-10 DIAGNOSIS — N201 Calculus of ureter: Secondary | ICD-10-CM | POA: Diagnosis not present

## 2021-08-10 DIAGNOSIS — D320 Benign neoplasm of cerebral meninges: Secondary | ICD-10-CM | POA: Diagnosis not present

## 2021-08-10 DIAGNOSIS — C61 Malignant neoplasm of prostate: Secondary | ICD-10-CM | POA: Diagnosis not present

## 2021-08-12 ENCOUNTER — Other Ambulatory Visit: Payer: Self-pay | Admitting: Urology

## 2021-08-18 DIAGNOSIS — D320 Benign neoplasm of cerebral meninges: Secondary | ICD-10-CM | POA: Diagnosis not present

## 2021-08-18 DIAGNOSIS — Z6829 Body mass index (BMI) 29.0-29.9, adult: Secondary | ICD-10-CM | POA: Diagnosis not present

## 2021-08-19 ENCOUNTER — Other Ambulatory Visit: Payer: Self-pay | Admitting: Neurological Surgery

## 2021-08-23 ENCOUNTER — Encounter (HOSPITAL_BASED_OUTPATIENT_CLINIC_OR_DEPARTMENT_OTHER): Payer: Self-pay | Admitting: Urology

## 2021-08-23 NOTE — Progress Notes (Signed)
Spoke w/ via phone for pre-op interview--- patient Lab needs dos---- [place]              Lab results------ in [place] test -----patient states asymptomatic no test needed Arrive at ------- 1045 on 08/26/2021 NPO after MN NO Solid Food.  Clear liquids from MN until--- 0945 Med rec completed Medications to take morning of surgery ----- norvasc, pepcid, tamsulosin, pravastatin Diabetic medication ----- N/A Patient instructed no nail polish to be worn day of surgery Patient instructed to bring photo id and insurance card day of surgery Patient aware to have Driver (ride ) / caregiver    for 24 hours after surgery -- Alva Garnet (wife) Patient Special Instructions ----- none Pre-Op special Istructions ----- none Patient verbalized understanding of instructions that were given at this phone interview. Patient denies shortness of breath, chest pain, fever, cough at this phone interview.  EKG on 07/05/2021 in Epic DOS Do not take: mobic, miralax, metronidazole cream; pt reports he is no longer taking ASA 81 mg   Lyndel Pleasure, RN

## 2021-08-24 NOTE — H&P (Signed)
I have kidney stones.  HPI: Jared Shelton is a 81 year-old male patient who was referred by Dr. Janith Lima, MD who is here for renal calculi.  The problem is on both sides.   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 She has had no hematuria. She has had some deep intermittent right mid back pain but has significant DDD on CT and had been on meloxicam for the last 2 years but that has been stopped. She had left ESWL several years ago and then had ureteroscopy with manipulation of a fragment back into the kidney and then ESWL was done again with success. She passed 3 prior stones as well. She has no history of UTI's.      ALLERGIES: No Allergies    MEDICATIONS: Estrace 0.5 mg tablet  Metoprolol Succinate 50 mg tablet, extended release 24 hr  Amlodipine Besylate 5 mg tablet  Atorvastatin Calcium 40 mg tablet  Fluid Pill  Indapamide 1.25 mg tablet 1 tablet PO Daily     GU PSH: None   NON-GU PSH: Appendectomy Hysterectomy     GU PMH: None   NON-GU PMH: Arthritis Hypercholesterolemia Hypertension    FAMILY HISTORY: 2 sons - Other Breast Cancer - Sister Heart Disease - Runs in Family Hematuria - Runs in Family Hypertension - Runs in Family Kidney Failure - Runs in Family Kidney Stones - Runs in Family Myocardial Infarction - Mother   SOCIAL HISTORY: Marital Status: Widowed Preferred Language: English; Race: White Current Smoking Status: Patient does not smoke anymore. Has not smoked since 07/26/1971.   Tobacco Use Assessment Completed: Used Tobacco in last 30 days? Drinks 1 caffeinated drink per day.    REVIEW OF SYSTEMS:    GU Review Male:   Patient reports get up at night to urinate and stream starts and stops. Patient denies frequent urination, hard to  postpone urination, burning /pain with urination, leakage of urine, trouble starting your stream, have to strain to urinate, and being pregnant.  Gastrointestinal (Upper):   Patient denies nausea, vomiting, and indigestion/ heartburn.  Gastrointestinal (Lower):   Patient denies diarrhea and constipation.  Constitutional:   Patient reports fatigue. Patient denies fever, night sweats, and weight loss.  Skin:   Patient denies skin rash/ lesion and itching.  Eyes:   Patient denies blurred vision and double vision.  Ears/ Nose/ Throat:   Patient reports sinus problems. Patient denies sore throat.  Hematologic/Lymphatic:   Patient denies swollen glands and easy bruising.  Cardiovascular:   Patient reports leg swelling. Patient denies chest pains.  Respiratory:   Patient reports cough.   Endocrine:   Patient denies excessive thirst.  Musculoskeletal:   Patient reports joint pain. Patient denies back pain.  Neurological:   Patient denies dizziness and headaches.  Psychologic:   Patient denies depression and anxiety.   VITAL SIGNS:      08/08/2021 09:29 AM  Weight 136 lb / 61.69 kg  Height 65 in / 165.1 cm  BP 151/65 mmHg  Heart Rate 80 /min  Temperature 98.6 F / 37 C  BMI 22.6 kg/m   MULTI-SYSTEM PHYSICAL EXAMINATION:    Constitutional: Well-nourished. No physical deformities. Normally developed. Good grooming.  Neck: Neck symmetrical, not swollen. Normal  tracheal position.  Respiratory: Normal breath sounds. No labored breathing, no use of accessory muscles.   Cardiovascular: Regular rate and rhythm. No murmur, no gallop.   Skin: No paleness, no jaundice, no cyanosis. No lesion, no ulcer, no rash.  Neurologic / Psychiatric: Oriented to time, oriented to place, oriented to person. No depression, no anxiety, no agitation.  Gastrointestinal: No mass, no tenderness, no rigidity, non obese abdomen.  Musculoskeletal: Normal gait and station of head and neck.     PAST DATA REVIEW: None    PROCEDURES:         KUB - 74018  A single view of the abdomen is obtained. the 59m RUP stone and the 124mleft renal pelvic stone are readily visible. I don't clearly see the smaller right renal stones. She has lumbar DDD but no gas or soft tissue abnormalities.       Patient confirmed No Neulasta OnPro Device.           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 She isn't obstructed and has no pain but I think the renal pelvic stone needs treatment at a minimum and we discussed bilateral treatment.  I discussed URS vs ESWL and she would like to proceed with bilateral URS with lithotripsy and stents. I have reviewed the risks of ureteroscopy including bleeding, infection, ureteral injury, need for a stent or secondary procedures, thrombotic events and anesthetic complications.   I will get a hypercalciuria profile today. Her prior stone was calcium oxalate.    PLAN:           Orders Labs Hypercalciura Profile  X-Rays: KUB          Schedule Return Visit/Planned Activity: Next Available Appointment - Schedule Surgery  Procedure: Unspecified Date - Cysto Uretero Lithotripsy - [phone] Notes: Next avail

## 2021-08-26 ENCOUNTER — Ambulatory Visit (HOSPITAL_BASED_OUTPATIENT_CLINIC_OR_DEPARTMENT_OTHER): Payer: PPO | Admitting: Certified Registered Nurse Anesthetist

## 2021-08-26 ENCOUNTER — Encounter (HOSPITAL_BASED_OUTPATIENT_CLINIC_OR_DEPARTMENT_OTHER): Payer: Self-pay | Admitting: Urology

## 2021-08-26 ENCOUNTER — Ambulatory Visit (HOSPITAL_BASED_OUTPATIENT_CLINIC_OR_DEPARTMENT_OTHER)
Admission: RE | Admit: 2021-08-26 | Discharge: 2021-08-26 | Disposition: A | Discharge status: Home / Self Care | Payer: PPO | Attending: Urology | Admitting: Urology

## 2021-08-26 ENCOUNTER — Encounter (HOSPITAL_BASED_OUTPATIENT_CLINIC_OR_DEPARTMENT_OTHER)
Admission: RE | Disposition: A | Discharge status: Home / Self Care | Payer: Self-pay | Source: Home / Self Care | Attending: Urology

## 2021-08-26 DIAGNOSIS — I129 Hypertensive chronic kidney disease with stage 1 through stage 4 chronic kidney disease, or unspecified chronic kidney disease: Secondary | ICD-10-CM | POA: Diagnosis not present

## 2021-08-26 DIAGNOSIS — M1990 Unspecified osteoarthritis, unspecified site: Secondary | ICD-10-CM | POA: Diagnosis not present

## 2021-08-26 DIAGNOSIS — Z87891 Personal history of nicotine dependence: Secondary | ICD-10-CM

## 2021-08-26 DIAGNOSIS — K219 Gastro-esophageal reflux disease without esophagitis: Secondary | ICD-10-CM | POA: Diagnosis not present

## 2021-08-26 DIAGNOSIS — I10 Essential (primary) hypertension: Secondary | ICD-10-CM

## 2021-08-26 DIAGNOSIS — N183 Chronic kidney disease, stage 3 unspecified: Secondary | ICD-10-CM | POA: Insufficient documentation

## 2021-08-26 DIAGNOSIS — N210 Calculus in bladder: Secondary | ICD-10-CM

## 2021-08-26 DIAGNOSIS — N138 Other obstructive and reflux uropathy: Secondary | ICD-10-CM

## 2021-08-26 DIAGNOSIS — N202 Calculus of kidney with calculus of ureter: Secondary | ICD-10-CM | POA: Diagnosis present

## 2021-08-26 HISTORY — PX: CYSTOSCOPY/URETEROSCOPY/HOLMIUM LASER/STENT PLACEMENT: SHX6546

## 2021-08-26 HISTORY — DX: Benign neoplasm of meninges, unspecified: D32.9

## 2021-08-26 LAB — POCT I-STAT, CHEM 8
BUN: 39 mg/dL — ABNORMAL HIGH (ref 8–23)
Calcium, Ion: 1.24 mmol/L (ref 1.15–1.40)
Chloride: 99 mmol/L (ref 98–111)
Creatinine, Ser: 1.3 mg/dL — ABNORMAL HIGH (ref 0.61–1.24)
Glucose, Bld: 100 mg/dL — ABNORMAL HIGH (ref 70–99)
HCT: 42 % (ref 39.0–52.0)
Hemoglobin: 14.3 g/dL (ref 13.0–17.0)
Potassium: 4.3 mmol/L (ref 3.5–5.1)
Sodium: 135 mmol/L (ref 135–145)
TCO2: 26 mmol/L (ref 22–32)

## 2021-08-26 SURGERY — CYSTOSCOPY/URETEROSCOPY/HOLMIUM LASER/STENT PLACEMENT
Anesthesia: General | Site: Pelvis | Laterality: Left

## 2021-08-26 MED ORDER — DEXAMETHASONE SODIUM PHOSPHATE 10 MG/ML IJ SOLN
INTRAMUSCULAR | Status: AC
  Filled 2021-08-26: qty 1

## 2021-08-26 MED ORDER — FENTANYL CITRATE (PF) 100 MCG/2ML IJ SOLN
INTRAMUSCULAR | Status: DC | PRN
Start: 2021-08-26 — End: 2021-08-26
  Administered 2021-08-26 (×2): 50 ug via INTRAVENOUS

## 2021-08-26 MED ORDER — 0.9 % SODIUM CHLORIDE (POUR BTL) OPTIME
TOPICAL | Status: DC | PRN
  Administered 2021-08-26: 500 mL

## 2021-08-26 MED ORDER — PROPOFOL 10 MG/ML IV BOLUS
INTRAVENOUS | Status: AC
  Filled 2021-08-26: qty 20

## 2021-08-26 MED ORDER — LIDOCAINE 2% (20 MG/ML) 5 ML SYRINGE
INTRAMUSCULAR | Status: DC | PRN
  Administered 2021-08-26: 100 mg via INTRAVENOUS

## 2021-08-26 MED ORDER — SODIUM CHLORIDE 0.9 % IV SOLN: INTRAVENOUS | Status: DC

## 2021-08-26 MED ORDER — ONDANSETRON HCL 4 MG/2ML IJ SOLN
INTRAMUSCULAR | Status: AC
  Filled 2021-08-26: qty 2

## 2021-08-26 MED ORDER — GLYCOPYRROLATE PF 0.2 MG/ML IJ SOSY
PREFILLED_SYRINGE | INTRAMUSCULAR | Status: DC | PRN
  Administered 2021-08-26: .2 mg via INTRAVENOUS

## 2021-08-26 MED ORDER — LIDOCAINE HCL (PF) 2 % IJ SOLN
INTRAMUSCULAR | Status: AC
  Filled 2021-08-26: qty 5

## 2021-08-26 MED ORDER — SODIUM CHLORIDE 0.9% FLUSH: 3.0000 mL | Freq: Two times a day (BID) | INTRAVENOUS | Status: DC

## 2021-08-26 MED ORDER — CEFAZOLIN SODIUM-DEXTROSE 2-4 GM/100ML-% IV SOLN
2.0000 g | INTRAVENOUS | Status: AC
  Administered 2021-08-26: 2 g via INTRAVENOUS

## 2021-08-26 MED ORDER — DEXAMETHASONE SODIUM PHOSPHATE 10 MG/ML IJ SOLN
INTRAMUSCULAR | Status: DC | PRN
  Administered 2021-08-26: 10 mg via INTRAVENOUS

## 2021-08-26 MED ORDER — FENTANYL CITRATE (PF) 100 MCG/2ML IJ SOLN
INTRAMUSCULAR | Status: AC
  Filled 2021-08-26: qty 2

## 2021-08-26 MED ORDER — PROPOFOL 10 MG/ML IV BOLUS
INTRAVENOUS | Status: DC | PRN
  Administered 2021-08-26: 140 mg via INTRAVENOUS

## 2021-08-26 MED ORDER — GLYCOPYRROLATE PF 0.2 MG/ML IJ SOSY
PREFILLED_SYRINGE | INTRAMUSCULAR | Status: AC
  Filled 2021-08-26: qty 1

## 2021-08-26 MED ORDER — CEFAZOLIN SODIUM-DEXTROSE 2-4 GM/100ML-% IV SOLN
INTRAVENOUS | Status: AC
  Filled 2021-08-26: qty 100

## 2021-08-26 MED ORDER — SODIUM CHLORIDE 0.9 % IR SOLN
Status: DC | PRN
  Administered 2021-08-26: 3000 mL

## 2021-08-26 SURGICAL SUPPLY — 28 items
BAG DRAIN URO-CYSTO SKYTR STRL (DRAIN) ×2 IMPLANT
BAG DRN UROCATH (DRAIN) ×1
BASKET STONE 1.7 NGAGE (UROLOGICAL SUPPLIES) IMPLANT
BASKET ZERO TIP NITINOL 2.4FR (BASKET) IMPLANT
BSKT STON RTRVL ZERO TP 2.4FR (BASKET)
CATH URET 5FR 28IN CONE TIP (BALLOONS)
CATH URET 5FR 28IN OPEN ENDED (CATHETERS) IMPLANT
CATH URET 5FR 70CM CONE TIP (BALLOONS) IMPLANT
CLOTH BEACON ORANGE TIMEOUT ST (SAFETY) ×2 IMPLANT
ELECT REM PT RETURN 9FT ADLT (ELECTROSURGICAL)
ELECTRODE REM PT RTRN 9FT ADLT (ELECTROSURGICAL) IMPLANT
EXTRACTOR STONE 1.7FRX115CM (UROLOGICAL SUPPLIES) ×1 IMPLANT
FIBER LASER FLEXIVA 365 (UROLOGICAL SUPPLIES) IMPLANT
GLOVE SURG SS PI 8.0 STRL IVOR (GLOVE) ×2 IMPLANT
GOWN STRL REUS W/TWL XL LVL3 (GOWN DISPOSABLE) ×2 IMPLANT
GUIDEWIRE ANG ZIPWIRE 038X150 (WIRE) IMPLANT
GUIDEWIRE STR DUAL SENSOR (WIRE) ×2 IMPLANT
INFUSOR MANOMETER BAG 3000ML (MISCELLANEOUS) IMPLANT
IV NS IRRIG 3000ML ARTHROMATIC (IV SOLUTION) ×2 IMPLANT
KIT TURNOVER CYSTO (KITS) ×2 IMPLANT
MANIFOLD NEPTUNE II (INSTRUMENTS) ×2 IMPLANT
NS IRRIG 500ML POUR BTL (IV SOLUTION) ×2 IMPLANT
PACK CYSTO (CUSTOM PROCEDURE TRAY) ×2 IMPLANT
SOL PREP POV-IOD 4OZ 10% (MISCELLANEOUS) ×1 IMPLANT
TRACTIP FLEXIVA PULS ID 200XHI (Laser) IMPLANT
TRACTIP FLEXIVA PULSE ID 200 (Laser)
TUBE CONNECTING 12X1/4 (SUCTIONS) ×2 IMPLANT
TUBING UROLOGY SET (TUBING) ×1 IMPLANT

## 2021-08-26 NOTE — Transfer of Care (Signed)
Immediate Anesthesia Transfer of Care Note  Patient: Jared Shelton.  Procedure(s) Performed: [name] STONE EXTRACTION (Left: Pelvis)  Patient Location: PACU  Anesthesia Type:General  Level of Consciousness: drowsy, patient cooperative and responds to stimulation  Airway & Oxygen Therapy: Patient Spontanous Breathing  Post-op Assessment: Report given to RN and Post -op Vital signs reviewed and stable  Post vital signs: Reviewed and stable  Last Vitals:  Vitals Value Taken Time  BP 160/81 08/26/21 1330  Temp    Pulse 63 08/26/21 1332  Resp 12 08/26/21 1332  SpO2 97 % 08/26/21 1332  Vitals shown include unvalidated device data.  Last Pain:  Vitals:   08/26/21 1030  TempSrc: Oral  PainSc: 0-No pain      Patients Stated Pain Goal: 5 (91/44/45 8483)  Complications: No notable events documented.

## 2021-08-26 NOTE — Interval H&P Note (Signed)
History and Physical Interval Note:  08/26/2021 10:44 AM  Jared Shelton.  has presented today for surgery, with the diagnosis of LEFT URETEROVESSICAL JUNCTION STONE.  The various methods of treatment have been discussed with the patient and family. After consideration of risks, benefits and other options for treatment, the patient has consented to  Procedure(s): CYSTOSCOPY LEFT URETEROSCOPY/HOLMIUM LASER/STENT PLACEMENT (Left) as a surgical intervention.  The patient's history has been reviewed, patient examined, no change in status, stable for surgery.  I have reviewed the patient's chart and labs.  Questions were answered to the patient's satisfaction.     Irine Seal

## 2021-08-26 NOTE — H&P (Signed)
I have prostate cancer.  HPI: Jared Shelton is a 81 year-old male established patient who is here evaluation for treatment of prostate cancer.    Jared Shelton returns today to discuss his recent biopsy and TURP. He has a better stream but he has frequency q2hrs and nocturia 3-5x nightly. His IPSS is still elevated at 22. His SHIM is 15. He saw a little blood recently. He has 3 active problems.   1. He had a right mid prostate nodule and a PSA of 5.86 as an indication for the biopsy. The TURP path was negative but he had 2 cores on the right on pathology with GG3 disease with intraductal disease adjacent. I had him staged with a PET scan and he had increased uptake in the left prostate and right SV which on my review could be filling of the TUR defect with tracer filled urine with reflux into the right SV. There was also slight uptake in a left ext iliac node that was read as equvocal.   Stage t2a N0(/1) M0 GG3 disease with possible intraductal disease. T3 is possible on the PET but I am sceptical of the tracer in the prostate and SV after a TURP.    2. 34m LUVJ stone without obstruction that was actually present on CT in 2/23 with [facility] that I just noted when reviewing the PET films and looking for prior imaging. This stone could be contributing to his residual irritative voiding symptoms and will need to be removed.   3. 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 He has no focal neurologic symptoms but has some memory issues. He needs a neurosurgical consultation.     AUA Symptom Score: Less than 50% of the time he has the sensation of not emptying his bladder completely when finished urinating. Less than 50% of the time he has to urinate again fewer than two hours after he has finished urinating. 50% of the time he has to start and stop again several times when he urinates. More than 50% of the time he finds it  difficult to postpone urination. More than 50% of the time he has a weak urinary stream. More than 50% of the time he has to push or strain to begin urination. He has to get up to urinate 3 times from the time he goes to bed until the time he gets up in the morning.   Calculated AUA Symptom Score: 22    ALLERGIES: Cat Dander No Known Drug Allergies Red Dye     MEDICATIONS: Aspirin 81 mg tablet,chewable  Amlodipine Besylate 10 mg tablet     GU PSH: Complex Uroflow - 11/10/2020 Cystoscopy - 11/10/2020 Cystoscopy TURP - 07/12/2021     NON-GU PSH: No Non-GU PSH    GU PMH: Nocturia - 07/26/2021, - 06/27/2021, - 11/10/2020, Urinary symptoms are significantly impacting his sleep. He is on Flomax which is continue but also gave him 25 mg of Myrbetriq to try. If he likes medication I will send a prescription to the pharmacy. We do over to up to 50 mg. 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 We talked about interventions such as TURP and rezum. , - 2021 Prostate Cancer - 07/26/2021 Prostate nodule w/ LUTS - 07/26/2021, He has persistent mod/severe LUTS despite tamsulosin and finasteride. We discuss options for treatment including a TURP, Rezum and Urolift and I  have recommended a TURP with a prostate [place] and biopsy. I have reviewed the risks in detail. I reviewd the risks of a TURP including bleeding, infection, incontinence, stricture, need for secondary procedures, ejaculatory and erectile dysfunction, thrombotic events, fluid overload and anesthetic complications. I explained that 95% of men will have relief of the obstructive symptoms and about 70% will have relief of the irritative symptoms. , - 06/27/2021, He has progressive LUTS with a right prostate nodule and a history of an elevated PSA on finasteride. I discussed options for therapy including doubling tamulsosin or changing the alpha blocker, Urolift, Rezum and TURP. I am going to  have him double the tamsulosin for now but think he will be best served with a TURP along with a prostate biopsy to r/o prostate cancer. I reviewd the risks of a TURP including bleeding, infection, incontinence, stricture, need for secondary procedures, ejaculatory and erectile dysfunction, thrombotic events, fluid overload and anesthetic complications. I explained that 95% of men will have relief of the obstructive symptoms and about 70% will have relief of the irritative symptoms. , - 11/10/2020, A see above, - 2021 Urinary Frequency - 07/26/2021, - 06/27/2021, - 11/10/2020 Urinary Urgency - 07/26/2021, - 06/27/2021, - 11/10/2020, - 2021 Elevated PSA, I will do a prostate biopsy at the time of the TURP and reviewed the risks of bleeding an infection from the biopsy. - 06/27/2021, I will repeat a PSA today and if it is further elevated I will push for a prostate [place] and biopsy. If it is back down, we will see how he does with the doubled tamsulosin but I may still want to consider a prostate [place] and biopsy. , - 11/10/2020, I discussed patient's PSA was actually in the age specific range for the patient and that we typically stop taking PSAs around age 23 because even if we found prostate cancer often times the risk benefit ratio in source not treating. However now that we know he has elevated PSA and I felt a nodule on DRE we discussed proceeding with prostate biopsy versus repeating PSA in bout 6 months. 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 I described how it works the best and about possible side effects. He will let me know how it goes. - 2021    NON-GU PMH: Arthritis Hypercholesterolemia Hypertension    FAMILY HISTORY: No Family History    SOCIAL HISTORY: Marital Status: Married Preferred Language: English; Ethnicity: Not Hispanic Or Latino; Race:  White Current Smoking Status: Patient does not smoke anymore. Has not smoked since 04/28/1979.   Tobacco Use Assessment Completed: Used Tobacco in last 30 days? Social Drinker.  Drinks 1 caffeinated drink per day.    REVIEW OF SYSTEMS:    GU Review Male:   Patient reports frequent urination, get up at night to urinate, and leakage of urine. Patient denies hard to postpone urination, burning/ pain with urination, stream starts and stops, trouble starting your stream, have to strain to urinate , erection problems, and penile pain.  Gastrointestinal (Upper):   Patient denies nausea, vomiting, and indigestion/ heartburn.  Gastrointestinal (Lower):   Patient denies diarrhea and constipation.  Constitutional:   Patient denies fever, night sweats, weight loss, and fatigue.  Skin:   Patient denies skin rash/ lesion and itching.  Eyes:   Patient denies blurred vision and double vision.  Ears/ Nose/ Throat:  Patient denies sore throat and sinus problems.  Hematologic/Lymphatic:   Patient denies swollen glands and easy bruising.  Cardiovascular:   Patient denies leg swelling and chest pains.  Respiratory:   Patient denies cough and shortness of breath.  Endocrine:   Patient denies excessive thirst.  Musculoskeletal:   Patient denies joint pain and back pain.  Neurological:   Patient denies headaches and dizziness.  Psychologic:   Patient denies depression and anxiety.   VITAL SIGNS: None   Complexity of Data:  Records Review:   AUA Symptom Score, Pathology Reports, Previous Patient Records, IIEF Score  Urodynamics Review:   Review Bladder Scan  X-Ray Review: PET Scan: Reviewed Films. Reviewed Report. Discussed With Patient.  C.T. Abdomen/Pelvis: Reviewed Films. Reviewed Report. Discussed With Patient. films from Feb 2023 reviewed.     11/10/20  PSA  Total PSA 5.86 ng/mL  Free PSA 1.91 ng/mL  % Free PSA 33 % PSA   Notes:                     MRI Brain reviewed.    PROCEDURES:         PVR  Ultrasound - 62831  Scanned Volume: 77 cc   ASSESSMENT:      ICD-10 Details  1 GU:   Prostate Cancer - C61 Chronic, Threat to Bodily Function - He has a T2a N0-1 M0 GG3 prostate cancer with adjacent intraductal changes. I think the PET scan findings suggestive of bulky prostate disease and SV invasion maybe related to filling of the TUR defect and reflux into the SV and I will reach out to radiology about this, but the prostate cancer will likely need treatment with adjuvant ADT and radiation therapy but he needs management of the stone and meningioma first.   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 I am going to get him set up for ureteroscopy and it is probably impacted and ESWL is less likely to be effective. I have reviewed the risks of ureteroscopy including bleeding, infection, ureteral injury, need for a stent or secondary procedures, thrombotic events and anesthetic complications.    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      PLAN:           Schedule Return Visit/Planned Activity: ASAP - Consult             Note: He has a large left fronto-temporal meningioma found incidentally on imaging for prostate cancer.   Return Visit/Planned Activity: ASAP - Schedule Surgery  Procedure: Unspecified Date - Cysto Uretero Lithotripsy - [phone] left          Document Letter(s):  Created for Patient: Clinical Summary         Notes:

## 2021-08-26 NOTE — Op Note (Signed)
Procedure: 1.  Cystoscopy with removal of bladder stone, simple. 2.  Application of fluoroscopy.  Preop diagnosis: Left distal ureteral stone.  Postop diagnosis: Bladder stone.  Surgeon: Dr. Irine Seal.  Anesthesia: General.  Specimen: Bladder stone.  Drain: None.  EBL: None.  Complications: None.  Indications: The patient is an 81 year old male who recently underwent transurethral resection of the prostate and prostate biopsy and was found on pathology to have high-grade prostate cancer.  He subsequently had a PMS a PET scan done for staging and was noted to have a 9 mm left distal ureteral stone.  He had no flank pain or symptoms to suggest a stone and no prior imaging so was not expected; however I was able to review radiology imaging from [facility] that had not previously been made aware of that showed that the stone had been present for several months.  Following the procedure he had persistent irritative urinary symptoms and was felt that the stone might be a contributor so removal was indicated.  He was also noted to have a 7 cm left brain meningioma and needs the stone removed in preparation for a craniotomy.  Procedure: He was taken operating room was given antibiotics and a general anesthetic.  He was placed in the lithotomy position and fitted with PAS hose.  His perineum and genitalia were prepped with Betadine solution he was draped in usual sterile fashion.  In preparation for the procedure he was positioned under fluoroscopy and it was noted that the stone was now in the midline location suggesting it was in the bladder.  Cystoscopy was then performed using a 23 [place] scope and 30 degree lens.  Examination revealed a normal urethra.  The external sphincter was then tacked.  He had a very generous TUR defect with significant healing but some shaggy necrotic material primarily at the bladder neck which could be contributing to his urinary symptoms as well.  The bladder wall  had mild trabeculation and there was a stone consistent with the ureteral stone previously at the left distal ureter and the bladder.  The right ureteral orifice was unremarkable.  The left ureteral orifice had some mild erythema consistent with a recently passed stone.  The stone was then grasped with an engage basket and removed intact.  I then passed the scope back into the bladder and removed several chunks of necrotic material from the prostatic urethra to see if this would help his irritative symptoms leaving a much smoother defect.  The bladder was drained and the cystoscope was removed.  He was taken down from lithotomy position, his anesthetic was reversed and he was moved recovery in stable condition.  The stone was given to his family to bring for analysis.Marland Kitchen

## 2021-08-26 NOTE — Anesthesia Postprocedure Evaluation (Signed)
Anesthesia Post Note  Patient: Jared Shelton.  Procedure(s) Performed: [name] STONE EXTRACTION (Left: Pelvis)     Patient location during evaluation: PACU Anesthesia Type: General Level of consciousness: awake Pain management: pain level controlled Respiratory status: spontaneous breathing Cardiovascular status: stable Postop Assessment: no apparent nausea or vomiting Anesthetic complications: no   No notable events documented.  Last Vitals:  Vitals:   08/26/21 1400 08/26/21 1403  BP:    Pulse: (!) 54 (!) 58  Resp: (!) 25 20  Temp:  36.5 C  SpO2: 99% 98%    Last Pain:  Vitals:   08/26/21 1400  TempSrc:   PainSc: 0-No pain                 Deeann Servidio

## 2021-08-26 NOTE — Anesthesia Preprocedure Evaluation (Addendum)
Anesthesia Evaluation  Patient identified by MRN, date of birth, ID band Patient awake    Reviewed: Allergy & Precautions, NPO status , Patient's Chart, lab work & pertinent test results  Airway Mallampati: II  TM Distance: >3 FB     Dental   Pulmonary former smoker,    breath sounds clear to auscultation       Cardiovascular hypertension,  Rhythm:Regular Rate:Normal     Neuro/Psych    GI/Hepatic GERD  ,  Endo/Other    Renal/GU      Musculoskeletal  (+) Arthritis ,   Abdominal   Peds  Hematology   Anesthesia Other Findings   Reproductive/Obstetrics                             Anesthesia Physical Anesthesia Plan  ASA: 3  Anesthesia Plan: General   Post-op Pain Management:    Induction:   PONV Risk Score and Plan: 2 and Ondansetron, Dexamethasone and Midazolam  Airway Management Planned: LMA  Additional Equipment:   Intra-op Plan:   Post-operative Plan: Extubation in OR  Informed Consent: I have reviewed the patients History and Physical, chart, labs and discussed the procedure including the risks, benefits and alternatives for the proposed anesthesia with the patient or authorized representative who has indicated his/her understanding and acceptance.     Dental advisory given  Plan Discussed with: CRNA and Anesthesiologist  Anesthesia Plan Comments:         Anesthesia Quick Evaluation

## 2021-08-26 NOTE — Discharge Instructions (Addendum)
CYSTOSCOPY HOME CARE INSTRUCTIONS  Activity: Rest for the remainder of the day.  Do not drive or operate equipment today.  You may resume normal activities in one to two days as instructed by your physician.   Meals: Drink plenty of liquids and eat light foods such as gelatin or soup this evening.  You may return to a normal meal plan tomorrow.  Return to Work: You may return to work in one to two days or as instructed by your physician.  Special Instructions / Symptoms: Call your physician if any of these symptoms occur:   -persistent or heavy bleeding  -bleeding which continues after first few urination  -large blood clots that are difficult to pass  -urine stream diminishes or stops completely  -fever equal to or higher than 101 degrees Farenheit.  -cloudy urine with a strong, foul odor  -severe pain  Females should always wipe from front to back after elimination.  You may feel some burning pain when you urinate.  This should disappear with time.  Applying moist heat to the lower abdomen or a hot tub bath may help relieve the pain. \  Please bring the stone to the office for analysis.     Post Anesthesia Home Care Instructions  Activity: Get plenty of rest for the remainder of the day. A responsible adult should stay with you for 24 hours following the procedure.  For the next 24 hours, DO NOT: -Drive a car -Paediatric nurse -Drink alcoholic beverages -Take any medication unless instructed by your physician -Make any legal decisions or sign important papers.  Meals: Start with liquid foods such as gelatin or soup. Progress to regular foods as tolerated. Avoid greasy, spicy, heavy foods. If nausea and/or vomiting occur, drink only clear liquids until the nausea and/or vomiting subsides. Call your physician if vomiting continues.  Special Instructions/Symptoms: Your throat may feel dry or sore from the anesthesia or the breathing tube placed in your throat during  surgery. If this causes discomfort, gargle with warm salt water. The discomfort should disappear within 24 hours.

## 2021-08-26 NOTE — Anesthesia Procedure Notes (Signed)
Procedure Name: LMA Insertion Date/Time: 08/26/2021 1:02 PM Performed by: Rogers Blocker, CRNA Pre-anesthesia Checklist: Patient identified, Emergency Drugs available, Suction available and Patient being monitored Patient Re-evaluated:Patient Re-evaluated prior to induction Oxygen Delivery Method: Circle System Utilized Preoxygenation: Pre-oxygenation with 100% oxygen Induction Type: IV induction Ventilation: Mask ventilation without difficulty LMA: LMA inserted LMA Size: 5.0 Number of attempts: 1 Placement Confirmation: positive ETCO2 Tube secured with: Tape Dental Injury: Teeth and Oropharynx as per pre-operative assessment

## 2021-08-29 ENCOUNTER — Encounter (HOSPITAL_BASED_OUTPATIENT_CLINIC_OR_DEPARTMENT_OTHER): Payer: Self-pay | Admitting: Urology

## 2021-08-30 NOTE — Pre-Procedure Instructions (Signed)
Surgical Instructions    Your procedure is scheduled on Tuesday 09/06/21.   Report to [facility] Main Entrance "A" at 10:10 A.M., then check in with the Admitting office.  Call this number if you have problems the morning of surgery:  [phone]   If you have any questions prior to your surgery date call [phone]: Open Monday-Friday 8am-4pm    Remember:  Do not eat after midnight the night before your surgery  You may drink clear liquids until 09:10 A.M. the morning of your surgery.   Clear liquids allowed are: Water, Non-Citrus Juices (without pulp), Carbonated Beverages, Clear Tea, Black Coffee ONLY (NO MILK, CREAM OR POWDERED CREAMER of any kind), and Gatorade    Take these medicines the morning of surgery with A SIP OF WATER:   acetaminophen (TYLENOL)   amLODipine (NORVASC)  dexamethasone (DECADRON)  famotidine (PEPCID)  pravastatin (PRAVACHOL)   As of today, STOP taking any Aspirin (unless otherwise instructed by your surgeon) Aleve, Naproxen, Ibuprofen, Motrin, Advil, Goody's, BC's, all herbal medications, fish oil, meloxicam (MOBIC), and all vitamins.           Do not wear jewelry or makeup Do not wear lotions, powders, perfumes/colognes, or deodorant. Do not shave 48 hours prior to surgery.  Men may shave face and neck. Do not bring valuables to the hospital. Do not wear nail polish, gel polish, artificial nails, or any other type of covering on natural nails (fingers and toes) If you have artificial nails or gel coating that need to be removed by a nail salon, please have this removed prior to surgery. Artificial nails or gel coating may interfere with anesthesia's ability to adequately monitor your vital signs.  [facility] is not responsible for any belongings or valuables. .   Do NOT Smoke (Tobacco/Vaping)  24 hours prior to your procedure  If you use a CPAP at night, you may bring your mask for your overnight stay.   Contacts, glasses, hearing aids,  dentures or partials may not be worn into surgery, please bring cases for these belongings   For patients admitted to the hospital, discharge time will be determined by your treatment team.   Patients discharged the day of surgery will not be allowed to drive home, and someone needs to stay with them for 24 hours.   SURGICAL WAITING ROOM VISITATION Patients having surgery or a procedure in a hospital may have two support people. Children under the age of 10 must have an adult with them who is not the patient. They may stay in the waiting area during the procedure and may switch out with other visitors. If the patient needs to stay at the hospital during part of their recovery, the visitor guidelines for inpatient rooms apply.  Please refer to the [facility] website for the visitor guidelines for Inpatients (after your surgery is over and you are in a regular room).       Special instructions:    Oral Hygiene is also important to reduce your risk of infection.  Remember - BRUSH YOUR TEETH THE MORNING OF SURGERY WITH YOUR REGULAR TOOTHPASTE   [place]- Preparing For Surgery  Before surgery, you can play an important role. Because skin is not sterile, your skin needs to be as free of germs as possible. You can reduce the number of germs on your skin by washing with CHG (chlorahexidine gluconate) Soap before surgery.  CHG is an antiseptic cleaner which kills germs and bonds with the skin to continue  killing germs even after washing.     Please do not use if you have an allergy to CHG or antibacterial soaps. If your skin becomes reddened/irritated stop using the CHG.  Do not shave (including legs and underarms) for at least 48 hours prior to first CHG shower. It is OK to shave your face.  Please follow these instructions carefully.     Shower the NIGHT BEFORE SURGERY and the MORNING OF SURGERY with CHG Soap.   If you chose to wash your hair, wash your hair first as usual with your  normal shampoo. After you shampoo, rinse your hair and body thoroughly to remove the shampoo.  Then [employer] and genitals (private parts) with your normal soap and rinse thoroughly to remove soap.  After that Use CHG Soap as you would any other liquid soap. You can apply CHG directly to the skin and wash gently with a scrungie or a clean washcloth.   Apply the CHG Soap to your body ONLY FROM THE NECK DOWN.  Do not use on open wounds or open sores. Avoid contact with your eyes, ears, mouth and genitals (private parts). Wash Face and genitals (private parts)  with your normal soap.   Wash thoroughly, paying special attention to the area where your surgery will be performed.  Thoroughly rinse your body with warm water from the neck down.  DO NOT shower/wash with your normal soap after using and rinsing off the CHG Soap.  Pat yourself dry with a CLEAN TOWEL.  Wear CLEAN PAJAMAS to bed the night before surgery  Place CLEAN SHEETS on your bed the night before your surgery  DO NOT SLEEP WITH PETS.   Day of Surgery:  Take a shower with CHG soap. Wear Clean/Comfortable clothing the morning of surgery Do not apply any deodorants/lotions.   Remember to brush your teeth WITH YOUR REGULAR TOOTHPASTE.    If you received a COVID test during your pre-op visit, it is requested that you wear a mask when out in public, stay away from anyone that may not be feeling well, and notify your surgeon if you develop symptoms. If you have been in contact with anyone that has tested positive in the last 10 days, please notify your surgeon.    Please read over the following fact sheets that you were given.

## 2021-08-31 ENCOUNTER — Other Ambulatory Visit: Payer: Self-pay

## 2021-08-31 ENCOUNTER — Encounter (HOSPITAL_COMMUNITY)
Admission: RE | Admit: 2021-08-31 | Discharge: 2021-08-31 | Disposition: A | Discharge status: Home / Self Care | Payer: PPO | Source: Ambulatory Visit | Attending: Neurological Surgery | Admitting: Neurological Surgery

## 2021-08-31 ENCOUNTER — Encounter (HOSPITAL_COMMUNITY): Payer: Self-pay

## 2021-08-31 VITALS — BP 132/77 | HR 57 | Temp 98.2°F | Resp 18 | Ht 68.0 in | Wt 190.5 lb

## 2021-08-31 DIAGNOSIS — Z01818 Encounter for other preprocedural examination: Secondary | ICD-10-CM

## 2021-08-31 DIAGNOSIS — Z01812 Encounter for preprocedural laboratory examination: Secondary | ICD-10-CM | POA: Diagnosis not present

## 2021-08-31 DIAGNOSIS — Z7982 Long term (current) use of aspirin: Secondary | ICD-10-CM | POA: Insufficient documentation

## 2021-08-31 HISTORY — DX: Personal history of urinary calculi: Z87.442

## 2021-08-31 LAB — CBC
HCT: 43.1 % (ref 39.0–52.0)
Hemoglobin: 14.2 g/dL (ref 13.0–17.0)
MCH: 27.2 pg (ref 26.0–34.0)
MCHC: 32.9 g/dL (ref 30.0–36.0)
MCV: 82.6 fL (ref 80.0–100.0)
Platelets: 304 10*3/uL (ref 150–400)
RBC: 5.22 MIL/uL (ref 4.22–5.81)
RDW: 14.6 % (ref 11.5–15.5)
WBC: 22.2 10*3/uL — ABNORMAL HIGH (ref 4.0–10.5)
nRBC: 0 % (ref 0.0–0.2)

## 2021-08-31 LAB — BASIC METABOLIC PANEL
Anion gap: 10 (ref 5–15)
BUN: 35 mg/dL — ABNORMAL HIGH (ref 8–23)
CO2: 22 mmol/L (ref 22–32)
Calcium: 8.8 mg/dL — ABNORMAL LOW (ref 8.9–10.3)
Chloride: 100 mmol/L (ref 98–111)
Creatinine, Ser: 1.46 mg/dL — ABNORMAL HIGH (ref 0.61–1.24)
GFR, Estimated: 48 mL/min — ABNORMAL LOW (ref >60)
Glucose, Bld: 106 mg/dL — ABNORMAL HIGH (ref 70–99)
Potassium: 4 mmol/L (ref 3.5–5.1)
Sodium: 132 mmol/L — ABNORMAL LOW (ref 135–145)

## 2021-08-31 LAB — SURGICAL PCR SCREEN
MRSA, PCR: NEGATIVE
Staphylococcus aureus: NEGATIVE

## 2021-08-31 NOTE — Progress Notes (Signed)
PCP - Kimberlee Shaw,MD Cardiologist - denies  PPM/ICD - denies Device Orders -  Rep Notified -   Chest x-ray - na EKG - 07/05/21 Stress Test - none ECHO - none Cardiac Cath - none   Sleep Study - none CPAP -   Fasting Blood Sugar - na Checks Blood Sugar _____ times a day  Blood Thinner Instructions:na Aspirin Instructions:Pt states his last dose of aspirin was 08/28/21 per surgeon's instructions  ERAS Protcol -clear liquids until 0910 PRE-SURGERY Ensure or G2- no  COVID TEST- na   Anesthesia review: no  Patient denies shortness of breath, fever, cough and chest pain at PAT appointment   All instructions explained to the patient, with a verbal understanding of the material. Patient agrees to go over the instructions while at home for a better understanding. Patient also instructed to wear a mask when out in public prior to surgery.. The opportunity to ask questions was provided.

## 2021-08-31 NOTE — Progress Notes (Signed)
[employer] sent to Dr. Ellene Route and  office called to report pt's abnormal WBC's of 22.2 today. Left secure voicemail with Janett Billow, surgical scheduler.

## 2021-09-02 DIAGNOSIS — R3915 Urgency of urination: Secondary | ICD-10-CM | POA: Diagnosis not present

## 2021-09-02 DIAGNOSIS — R350 Frequency of micturition: Secondary | ICD-10-CM | POA: Diagnosis not present

## 2021-09-06 ENCOUNTER — Other Ambulatory Visit: Payer: Self-pay

## 2021-09-06 ENCOUNTER — Inpatient Hospital Stay (HOSPITAL_COMMUNITY): Payer: PPO | Admitting: Certified Registered"

## 2021-09-06 ENCOUNTER — Inpatient Hospital Stay (HOSPITAL_COMMUNITY): Payer: PPO | Admitting: Physician Assistant

## 2021-09-06 ENCOUNTER — Encounter (HOSPITAL_COMMUNITY)
Admission: RE | Disposition: A | Discharge status: Home Health | Payer: Self-pay | Source: Home / Self Care | Attending: Neurological Surgery

## 2021-09-06 ENCOUNTER — Encounter (HOSPITAL_COMMUNITY): Payer: Self-pay | Admitting: Neurological Surgery

## 2021-09-06 ENCOUNTER — Inpatient Hospital Stay (HOSPITAL_COMMUNITY): Payer: PPO

## 2021-09-06 ENCOUNTER — Inpatient Hospital Stay (HOSPITAL_COMMUNITY)
Admission: RE | Admit: 2021-09-06 | Discharge: 2021-09-09 | DRG: 025 | Disposition: A | Discharge status: Home Health | Payer: PPO | Attending: Neurological Surgery | Admitting: Neurological Surgery

## 2021-09-06 DIAGNOSIS — Z87891 Personal history of nicotine dependence: Secondary | ICD-10-CM | POA: Diagnosis not present

## 2021-09-06 DIAGNOSIS — I6789 Other cerebrovascular disease: Secondary | ICD-10-CM | POA: Diagnosis not present

## 2021-09-06 DIAGNOSIS — I611 Nontraumatic intracerebral hemorrhage in hemisphere, cortical: Secondary | ICD-10-CM | POA: Diagnosis not present

## 2021-09-06 DIAGNOSIS — D329 Benign neoplasm of meninges, unspecified: Secondary | ICD-10-CM | POA: Diagnosis not present

## 2021-09-06 DIAGNOSIS — Z7982 Long term (current) use of aspirin: Secondary | ICD-10-CM | POA: Diagnosis not present

## 2021-09-06 DIAGNOSIS — E7800 Pure hypercholesterolemia, unspecified: Secondary | ICD-10-CM | POA: Diagnosis not present

## 2021-09-06 DIAGNOSIS — G936 Cerebral edema: Secondary | ICD-10-CM | POA: Diagnosis present

## 2021-09-06 DIAGNOSIS — I10 Essential (primary) hypertension: Secondary | ICD-10-CM | POA: Diagnosis not present

## 2021-09-06 DIAGNOSIS — M1990 Unspecified osteoarthritis, unspecified site: Secondary | ICD-10-CM

## 2021-09-06 DIAGNOSIS — N200 Calculus of kidney: Secondary | ICD-10-CM | POA: Diagnosis present

## 2021-09-06 DIAGNOSIS — D320 Benign neoplasm of cerebral meninges: Principal | ICD-10-CM | POA: Diagnosis present

## 2021-09-06 DIAGNOSIS — Z79899 Other long term (current) drug therapy: Secondary | ICD-10-CM

## 2021-09-06 DIAGNOSIS — G9389 Other specified disorders of brain: Secondary | ICD-10-CM | POA: Diagnosis not present

## 2021-09-06 DIAGNOSIS — R1310 Dysphagia, unspecified: Secondary | ICD-10-CM | POA: Diagnosis not present

## 2021-09-06 DIAGNOSIS — N289 Disorder of kidney and ureter, unspecified: Secondary | ICD-10-CM | POA: Diagnosis not present

## 2021-09-06 HISTORY — PX: CRANIOTOMY: SHX93

## 2021-09-06 LAB — PREPARE RBC (CROSSMATCH)

## 2021-09-06 LAB — ABO/RH: ABO/RH(D): O POS

## 2021-09-06 SURGERY — CRANIOTOMY TUMOR EXCISION
Anesthesia: General | Laterality: Left

## 2021-09-06 MED ORDER — LACTATED RINGERS IV SOLN: INTRAVENOUS | Status: DC

## 2021-09-06 MED ORDER — FENTANYL CITRATE (PF) 250 MCG/5ML IJ SOLN
INTRAMUSCULAR | Status: DC | PRN
  Administered 2021-09-06: 100 ug via INTRAVENOUS
  Administered 2021-09-06: 50 ug via INTRAVENOUS
  Administered 2021-09-06 (×2): 100 ug via INTRAVENOUS

## 2021-09-06 MED ORDER — ROCURONIUM BROMIDE 10 MG/ML (PF) SYRINGE
PREFILLED_SYRINGE | INTRAVENOUS | Status: DC | PRN
  Administered 2021-09-06: 20 mg via INTRAVENOUS
  Administered 2021-09-06: 100 mg via INTRAVENOUS

## 2021-09-06 MED ORDER — PROPOFOL 500 MG/50ML IV EMUL
INTRAVENOUS | Status: DC | PRN
  Administered 2021-09-06: 60 ug/kg/min via INTRAVENOUS

## 2021-09-06 MED ORDER — PROPOFOL 10 MG/ML IV BOLUS
INTRAVENOUS | Status: AC
  Filled 2021-09-06: qty 20

## 2021-09-06 MED ORDER — THROMBIN 5000 UNITS EX SOLR
CUTANEOUS | Status: AC
  Filled 2021-09-06: qty 5000

## 2021-09-06 MED ORDER — BUPIVACAINE HCL (PF) 0.5 % IJ SOLN
INTRAMUSCULAR | Status: AC
Start: 2021-09-06 — End: ?
  Filled 2021-09-06: qty 30

## 2021-09-06 MED ORDER — FLEET ENEMA 7-19 GM/118ML RE ENEM: 1.0000 | ENEMA | Freq: Once | RECTAL | Status: DC | PRN

## 2021-09-06 MED ORDER — ONDANSETRON HCL 4 MG/2ML IJ SOLN: 4.0000 mg | INTRAMUSCULAR | Status: DC | PRN

## 2021-09-06 MED ORDER — CLEVIDIPINE BUTYRATE 0.5 MG/ML IV EMUL
0.0000 mg/h | INTRAVENOUS | Status: DC
  Administered 2021-09-07 (×5): 21 mg/h via INTRAVENOUS
  Filled 2021-09-06: qty 200
  Filled 2021-09-06 (×3): qty 100

## 2021-09-06 MED ORDER — AMLODIPINE BESYLATE 10 MG PO TABS
10.0000 mg | ORAL_TABLET | Freq: Every day | ORAL | Status: DC
  Administered 2021-09-07 – 2021-09-09 (×3): 10 mg via ORAL
  Filled 2021-09-06 (×3): qty 1

## 2021-09-06 MED ORDER — CEFAZOLIN SODIUM-DEXTROSE 2-4 GM/100ML-% IV SOLN
2.0000 g | INTRAVENOUS | Status: AC
  Administered 2021-09-06: 2 g via INTRAVENOUS

## 2021-09-06 MED ORDER — OXYCODONE HCL 5 MG/5ML PO SOLN: 5.0000 mg | Freq: Once | ORAL | Status: DC | PRN

## 2021-09-06 MED ORDER — CHLORHEXIDINE GLUCONATE CLOTH 2 % EX PADS: 6.0000 | MEDICATED_PAD | Freq: Once | CUTANEOUS | Status: DC

## 2021-09-06 MED ORDER — ONDANSETRON HCL 4 MG/2ML IJ SOLN
INTRAMUSCULAR | Status: DC | PRN
  Administered 2021-09-06: 4 mg via INTRAVENOUS

## 2021-09-06 MED ORDER — ACETAMINOPHEN 500 MG PO TABS
500.0000 mg | ORAL_TABLET | Freq: Four times a day (QID) | ORAL | Status: DC | PRN
  Administered 2021-09-06 – 2021-09-08 (×4): 1000 mg via ORAL
  Filled 2021-09-06 (×4): qty 2

## 2021-09-06 MED ORDER — FENTANYL CITRATE (PF) 250 MCG/5ML IJ SOLN
INTRAMUSCULAR | Status: AC
  Filled 2021-09-06: qty 5

## 2021-09-06 MED ORDER — CLEVIDIPINE BUTYRATE 0.5 MG/ML IV EMUL: 0.0000 mg/h | INTRAVENOUS | Status: DC

## 2021-09-06 MED ORDER — LABETALOL HCL 5 MG/ML IV SOLN
10.0000 mg | INTRAVENOUS | Status: DC | PRN
  Administered 2021-09-07: 20 mg via INTRAVENOUS
  Administered 2021-09-07: 40 mg via INTRAVENOUS
  Administered 2021-09-07: 20 mg via INTRAVENOUS
  Filled 2021-09-06: qty 4
  Filled 2021-09-06: qty 8
  Filled 2021-09-06: qty 4

## 2021-09-06 MED ORDER — POVIDONE-IODINE 10 % EX OINT
TOPICAL_OINTMENT | CUTANEOUS | Status: DC | PRN
  Administered 2021-09-06: 1 via TOPICAL

## 2021-09-06 MED ORDER — BISACODYL 10 MG RE SUPP: 10.0000 mg | Freq: Every day | RECTAL | Status: DC | PRN

## 2021-09-06 MED ORDER — CHLORHEXIDINE GLUCONATE 0.12 % MT SOLN
15.0000 mL | Freq: Once | OROMUCOSAL | Status: AC
  Administered 2021-09-06: 15 mL via OROMUCOSAL

## 2021-09-06 MED ORDER — CHLORHEXIDINE GLUCONATE 0.12 % MT SOLN
OROMUCOSAL | Status: AC
  Filled 2021-09-06: qty 15

## 2021-09-06 MED ORDER — POLYETHYLENE GLYCOL 3350 17 G PO PACK: 17.0000 g | PACK | Freq: Every day | ORAL | Status: DC | PRN

## 2021-09-06 MED ORDER — LEVETIRACETAM IN NACL 500 MG/100ML IV SOLN
500.0000 mg | Freq: Two times a day (BID) | INTRAVENOUS | Status: DC
  Administered 2021-09-06 – 2021-09-07 (×2): 500 mg via INTRAVENOUS
  Filled 2021-09-06 (×2): qty 100

## 2021-09-06 MED ORDER — ESMOLOL HCL 100 MG/10ML IV SOLN
INTRAVENOUS | Status: AC
  Filled 2021-09-06: qty 10

## 2021-09-06 MED ORDER — FENTANYL CITRATE (PF) 100 MCG/2ML IJ SOLN: 25.0000 ug | INTRAMUSCULAR | Status: DC | PRN

## 2021-09-06 MED ORDER — SODIUM CHLORIDE 0.9% IV SOLUTION: Freq: Once | INTRAVENOUS | Status: DC

## 2021-09-06 MED ORDER — HYDROCODONE-ACETAMINOPHEN 5-325 MG PO TABS
1.0000 | ORAL_TABLET | ORAL | Status: DC | PRN
  Administered 2021-09-06 – 2021-09-08 (×5): 1 via ORAL
  Filled 2021-09-06 (×6): qty 1

## 2021-09-06 MED ORDER — LIDOCAINE-EPINEPHRINE 1 %-1:100000 IJ SOLN
INTRAMUSCULAR | Status: DC | PRN
  Administered 2021-09-06: 10 mL

## 2021-09-06 MED ORDER — ACETAMINOPHEN 500 MG PO TABS: 1000.0000 mg | ORAL_TABLET | Freq: Once | ORAL | Status: DC | PRN

## 2021-09-06 MED ORDER — HEMOSTATIC AGENTS (NO CHARGE) OPTIME
TOPICAL | Status: DC | PRN
  Administered 2021-09-06: 1 via TOPICAL

## 2021-09-06 MED ORDER — PRAVASTATIN SODIUM 40 MG PO TABS
40.0000 mg | ORAL_TABLET | Freq: Every day | ORAL | Status: DC
  Administered 2021-09-07 – 2021-09-09 (×3): 40 mg via ORAL
  Filled 2021-09-06 (×3): qty 1

## 2021-09-06 MED ORDER — THROMBIN 5000 UNITS EX SOLR
OROMUCOSAL | Status: DC | PRN
  Administered 2021-09-06: 5 mL via TOPICAL

## 2021-09-06 MED ORDER — CLEVIDIPINE BUTYRATE 0.5 MG/ML IV EMUL
INTRAVENOUS | Status: AC
  Administered 2021-09-06: 50 mg
  Filled 2021-09-06: qty 100

## 2021-09-06 MED ORDER — MORPHINE SULFATE (PF) 2 MG/ML IV SOLN
1.0000 mg | INTRAVENOUS | Status: DC | PRN
  Administered 2021-09-06 – 2021-09-07 (×3): 2 mg via INTRAVENOUS
  Filled 2021-09-06 (×3): qty 1

## 2021-09-06 MED ORDER — LIDOCAINE 2% (20 MG/ML) 5 ML SYRINGE
INTRAMUSCULAR | Status: DC | PRN
  Administered 2021-09-06: 60 mg via INTRAVENOUS

## 2021-09-06 MED ORDER — ONDANSETRON HCL 4 MG PO TABS
4.0000 mg | ORAL_TABLET | ORAL | Status: DC | PRN
Start: 2021-09-06 — End: 2021-09-09

## 2021-09-06 MED ORDER — ACETAMINOPHEN 160 MG/5ML PO SOLN: 1000.0000 mg | Freq: Once | ORAL | Status: DC | PRN

## 2021-09-06 MED ORDER — CHLORHEXIDINE GLUCONATE 0.12 % MT SOLN: 15.0000 mL | Freq: Once | OROMUCOSAL | Status: DC

## 2021-09-06 MED ORDER — POLYVINYL ALCOHOL 1.4 % OP SOLN: 1.0000 [drp] | Freq: Three times a day (TID) | OPHTHALMIC | Status: DC | PRN

## 2021-09-06 MED ORDER — CEFAZOLIN SODIUM-DEXTROSE 2-4 GM/100ML-% IV SOLN
INTRAVENOUS | Status: AC
  Filled 2021-09-06: qty 100

## 2021-09-06 MED ORDER — THROMBIN 20000 UNITS EX SOLR
CUTANEOUS | Status: AC
Start: 2021-09-06 — End: ?
  Filled 2021-09-06: qty 20000

## 2021-09-06 MED ORDER — SODIUM CHLORIDE 0.9 % IV SOLN: INTRAVENOUS | Status: DC

## 2021-09-06 MED ORDER — FLUTICASONE PROPIONATE 50 MCG/ACT NA SUSP: 1.0000 | Freq: Every day | NASAL | Status: DC | PRN

## 2021-09-06 MED ORDER — 0.9 % SODIUM CHLORIDE (POUR BTL) OPTIME
TOPICAL | Status: DC | PRN
  Administered 2021-09-06: 3000 mL

## 2021-09-06 MED ORDER — SENNA 8.6 MG PO TABS
1.0000 | ORAL_TABLET | Freq: Two times a day (BID) | ORAL | Status: DC
  Administered 2021-09-06 – 2021-09-09 (×6): 8.6 mg via ORAL
  Filled 2021-09-06 (×6): qty 1

## 2021-09-06 MED ORDER — LIDOCAINE-EPINEPHRINE 1 %-1:100000 IJ SOLN
INTRAMUSCULAR | Status: AC
  Filled 2021-09-06: qty 1

## 2021-09-06 MED ORDER — ACETAMINOPHEN 10 MG/ML IV SOLN: 1000.0000 mg | Freq: Once | INTRAVENOUS | Status: DC | PRN

## 2021-09-06 MED ORDER — POVIDONE-IODINE 10 % EX OINT
TOPICAL_OINTMENT | CUTANEOUS | Status: AC
Start: 2021-09-06 — End: ?
  Filled 2021-09-06: qty 28.35

## 2021-09-06 MED ORDER — ROCURONIUM BROMIDE 10 MG/ML (PF) SYRINGE
PREFILLED_SYRINGE | INTRAVENOUS | Status: AC
  Filled 2021-09-06: qty 20

## 2021-09-06 MED ORDER — FAMOTIDINE 20 MG PO TABS
20.0000 mg | ORAL_TABLET | Freq: Every day | ORAL | Status: DC
  Administered 2021-09-07 – 2021-09-09 (×3): 20 mg via ORAL
  Filled 2021-09-06 (×3): qty 1

## 2021-09-06 MED ORDER — OXYCODONE HCL 5 MG PO TABS: 5.0000 mg | ORAL_TABLET | Freq: Once | ORAL | Status: DC | PRN

## 2021-09-06 MED ORDER — ORAL CARE MOUTH RINSE: 15.0000 mL | Freq: Once | OROMUCOSAL | Status: AC

## 2021-09-06 MED ORDER — DEXAMETHASONE 4 MG PO TABS
2.0000 mg | ORAL_TABLET | Freq: Two times a day (BID) | ORAL | Status: DC
  Administered 2021-09-06 – 2021-09-09 (×6): 2 mg via ORAL
  Filled 2021-09-06 (×2): qty 4
  Filled 2021-09-06 (×2): qty 1
  Filled 2021-09-06 (×2): qty 4

## 2021-09-06 MED ORDER — POLYETHYLENE GLYCOL 3350 17 G PO PACK
17.0000 g | PACK | Freq: Every day | ORAL | Status: DC
  Administered 2021-09-06 – 2021-09-09 (×3): 17 g via ORAL
  Filled 2021-09-06 (×3): qty 1

## 2021-09-06 MED ORDER — CLEVIDIPINE BUTYRATE 0.5 MG/ML IV EMUL
INTRAVENOUS | Status: DC | PRN
  Administered 2021-09-06: 4 mg/h via INTRAVENOUS

## 2021-09-06 MED ORDER — PANTOPRAZOLE SODIUM 40 MG IV SOLR
40.0000 mg | Freq: Every day | INTRAVENOUS | Status: DC
  Administered 2021-09-06: 40 mg via INTRAVENOUS

## 2021-09-06 MED ORDER — DEXAMETHASONE SODIUM PHOSPHATE 10 MG/ML IJ SOLN
INTRAMUSCULAR | Status: DC | PRN
  Administered 2021-09-06: 10 mg via INTRAVENOUS

## 2021-09-06 MED ORDER — BUPIVACAINE HCL (PF) 0.5 % IJ SOLN
INTRAMUSCULAR | Status: DC | PRN
  Administered 2021-09-06: 10 mL

## 2021-09-06 MED ORDER — PROPOFOL 10 MG/ML IV BOLUS
INTRAVENOUS | Status: DC | PRN
  Administered 2021-09-06: 30 mg via INTRAVENOUS
  Administered 2021-09-06: 40 mg via INTRAVENOUS
  Administered 2021-09-06: 60 mg via INTRAVENOUS
  Administered 2021-09-06: 100 mg via INTRAVENOUS

## 2021-09-06 MED ORDER — ESMOLOL HCL 100 MG/10ML IV SOLN
INTRAVENOUS | Status: DC | PRN
  Administered 2021-09-06: 30 mg via INTRAVENOUS
  Administered 2021-09-06: 20 mg via INTRAVENOUS

## 2021-09-06 MED ORDER — PROMETHAZINE HCL 25 MG PO TABS: 12.5000 mg | ORAL_TABLET | ORAL | Status: DC | PRN

## 2021-09-06 MED ORDER — PANTOPRAZOLE SODIUM 40 MG PO TBEC: 40.0000 mg | DELAYED_RELEASE_TABLET | Freq: Every day | ORAL | Status: DC

## 2021-09-06 MED ORDER — DOCUSATE SODIUM 100 MG PO CAPS
100.0000 mg | ORAL_CAPSULE | Freq: Two times a day (BID) | ORAL | Status: DC
  Administered 2021-09-06 – 2021-09-09 (×6): 100 mg via ORAL
  Filled 2021-09-06 (×6): qty 1

## 2021-09-06 MED ORDER — SODIUM CHLORIDE 0.9 % IV SOLN
INTRAVENOUS | Status: DC | PRN
  Administered 2021-09-06: 1000 mg via INTRAVENOUS

## 2021-09-06 MED ORDER — ORAL CARE MOUTH RINSE: 15.0000 mL | Freq: Once | OROMUCOSAL | Status: DC

## 2021-09-06 MED ORDER — CEFAZOLIN SODIUM-DEXTROSE 1-4 GM/50ML-% IV SOLN
1.0000 g | Freq: Three times a day (TID) | INTRAVENOUS | Status: AC
  Administered 2021-09-06 – 2021-09-07 (×2): 1 g via INTRAVENOUS
  Filled 2021-09-06 (×2): qty 50

## 2021-09-06 MED ORDER — THROMBIN 20000 UNITS EX SOLR
CUTANEOUS | Status: DC | PRN
  Administered 2021-09-06: 20 mL via TOPICAL

## 2021-09-06 SURGICAL SUPPLY — 75 items
BAG COUNTER SPONGE SURGICOUNT (BAG) ×4 IMPLANT
BATTERY IQ STERILE (MISCELLANEOUS) ×2 IMPLANT
BIT DRILL WIRE PASS 1.3MM (BIT) IMPLANT
BLADE CLIPPER SURG (BLADE) ×6 IMPLANT
BNDG GAUZE DERMACEA FLUFF (GAUZE/BANDAGES/DRESSINGS) ×1
BNDG GAUZE DERMACEA FLUFF 4 (GAUZE/BANDAGES/DRESSINGS) IMPLANT
BNDG GAUZE ELAST 4 BULKY (GAUZE/BANDAGES/DRESSINGS) ×2 IMPLANT
BUR ACORN 6.0 PRECISION (BURR) ×2 IMPLANT
BUR SPIRAL ROUTER 2.3 (BUR) ×2 IMPLANT
CANISTER SUCT 3000ML PPV (MISCELLANEOUS) ×2 IMPLANT
CLIP VESOCCLUDE MED 6/CT (CLIP) IMPLANT
CNTNR URN SCR LID CUP LEK RST (MISCELLANEOUS) ×1 IMPLANT
CONT SPEC 4OZ STRL OR WHT (MISCELLANEOUS) ×2
COTTONBALL LRG STERILE PKG (GAUZE/BANDAGES/DRESSINGS) IMPLANT
COVER BACK TABLE 60X90IN (DRAPES) ×4 IMPLANT
COVER BURR HOLE UNIV 10 (Orthopedic Implant) ×1 IMPLANT
DRAIN CHANNEL 10M FLAT 3/4 FLT (DRAIN) IMPLANT
DRAIN SUBARACHNOID (WOUND CARE) IMPLANT
DRAPE MICROSCOPE LEICA (MISCELLANEOUS) IMPLANT
DRAPE WARM FLUID 44X44 (DRAPES) ×2 IMPLANT
DRILL WIRE PASS 1.3MM (BIT)
DRSG ADAPTIC 3X8 NADH LF (GAUZE/BANDAGES/DRESSINGS) ×2 IMPLANT
DRSG OPSITE 4X5.5 SM (GAUZE/BANDAGES/DRESSINGS) ×3 IMPLANT
DRSG TELFA 3X8 NADH (GAUZE/BANDAGES/DRESSINGS) ×2 IMPLANT
DURAPREP 6ML APPLICATOR 50/CS (WOUND CARE) ×2 IMPLANT
ELECT REM PT RETURN 9FT ADLT (ELECTROSURGICAL) ×2
ELECTRODE REM PT RTRN 9FT ADLT (ELECTROSURGICAL) ×1 IMPLANT
EVACUATOR 1/8 PVC DRAIN (DRAIN) IMPLANT
EVACUATOR SILICONE 100CC (DRAIN) IMPLANT
FORCEPS BIPOLAR SPETZLER 8 1.0 (NEUROSURGERY SUPPLIES) ×1 IMPLANT
GAUZE 4X4 16PLY ~~LOC~~+RFID DBL (SPONGE) IMPLANT
GAUZE SPONGE 4X4 12PLY STRL (GAUZE/BANDAGES/DRESSINGS) IMPLANT
GLOVE BIOGEL PI IND STRL 8.5 (GLOVE) ×1 IMPLANT
GLOVE BIOGEL PI INDICATOR 8.5 (GLOVE) ×1
GLOVE ECLIPSE 8.5 STRL (GLOVE) ×3 IMPLANT
GLOVE EXAM NITRILE XL STR (GLOVE) IMPLANT
GOWN STRL REUS W/ TWL LRG LVL3 (GOWN DISPOSABLE) IMPLANT
GOWN STRL REUS W/ TWL XL LVL3 (GOWN DISPOSABLE) ×1 IMPLANT
GOWN STRL REUS W/TWL 2XL LVL3 (GOWN DISPOSABLE) ×2 IMPLANT
GOWN STRL REUS W/TWL LRG LVL3 (GOWN DISPOSABLE)
GOWN STRL REUS W/TWL XL LVL3 (GOWN DISPOSABLE) ×2
GRAFT DURAGEN MATRIX 3WX3L (Graft) ×1 IMPLANT
GRAFT DURAGEN MATRIX 3X3 SNGL (Graft) IMPLANT
HEMOSTAT SURGICEL 2X14 (HEMOSTASIS) ×1 IMPLANT
KIT BASIN OR (CUSTOM PROCEDURE TRAY) ×2 IMPLANT
KIT TURNOVER KIT B (KITS) ×2 IMPLANT
NEEDLE HYPO 22GX1.5 SAFETY (NEEDLE) ×2 IMPLANT
NS IRRIG 1000ML POUR BTL (IV SOLUTION) ×4 IMPLANT
PACK CRANIOTOMY CUSTOM (CUSTOM PROCEDURE TRAY) ×2 IMPLANT
PAD ARMBOARD 7.5X6 YLW CONV (MISCELLANEOUS) ×6 IMPLANT
PAD DRESSING TELFA 3X8 NADH (GAUZE/BANDAGES/DRESSINGS) IMPLANT
PATTIES SURGICAL .25X.25 (GAUZE/BANDAGES/DRESSINGS) IMPLANT
PATTIES SURGICAL .5 X.5 (GAUZE/BANDAGES/DRESSINGS) IMPLANT
PATTIES SURGICAL .5 X3 (DISPOSABLE) IMPLANT
PATTIES SURGICAL 1/4 X 3 (GAUZE/BANDAGES/DRESSINGS) IMPLANT
PATTIES SURGICAL 1X1 (DISPOSABLE) IMPLANT
PATTIES SURGICAL 3 X3 (GAUZE/BANDAGES/DRESSINGS)
PATTIES SURGICAL 3X3 (GAUZE/BANDAGES/DRESSINGS) IMPLANT
PIN MAYFIELD SKULL DISP (PIN) ×1 IMPLANT
PLATE BONE 12 2H TARGET XL (Plate) ×3 IMPLANT
SCREW UNIII AXS SD 1.5X4 (Screw) ×10 IMPLANT
SCREW UNIII AXS SD 1.5X5 (Screw) ×2 IMPLANT
SPIKE FLUID TRANSFER (MISCELLANEOUS) ×1 IMPLANT
SPONGE NEURO XRAY DETECT 1X3 (DISPOSABLE) ×1 IMPLANT
SPONGE SURGIFOAM ABS GEL 100 (HEMOSTASIS) ×1 IMPLANT
STAPLER SKIN PROX WIDE 3.9 (STAPLE) ×2 IMPLANT
SUT ETHILON 3 0 FSL (SUTURE) IMPLANT
SUT NURALON 4 0 TR CR/8 (SUTURE) ×4 IMPLANT
SUT VIC AB 2-0 CP2 18 (SUTURE) ×5 IMPLANT
SUT VIC AB 4-0 RB1 18 (SUTURE) ×2 IMPLANT
TOWEL GREEN STERILE (TOWEL DISPOSABLE) ×2 IMPLANT
TOWEL GREEN STERILE FF (TOWEL DISPOSABLE) ×2 IMPLANT
TRAY FOLEY MTR SLVR 16FR STAT (SET/KITS/TRAYS/PACK) ×1 IMPLANT
UNDERPAD 30X36 HEAVY ABSORB (UNDERPADS AND DIAPERS) IMPLANT
WATER STERILE IRR 1000ML POUR (IV SOLUTION) ×2 IMPLANT

## 2021-09-06 NOTE — Anesthesia Procedure Notes (Signed)
Arterial Line Insertion Start/End6/13/2023 10:50 AM, 09/06/2021 11:00 AM Performed by: Wilburn Cornelia, CRNA  Patient location: Pre-op. Preanesthetic checklist: patient identified, IV checked, site marked, risks and benefits discussed, surgical consent, monitors and equipment checked, pre-op evaluation, timeout performed and anesthesia consent Lidocaine 1% used for infiltration Left, radial was placed Catheter size: 20 G Hand hygiene performed , maximum sterile barriers used  and Seldinger technique used Allen's test indicative of satisfactory collateral circulation Attempts: 1 Procedure performed without using ultrasound guided technique. Following insertion, dressing applied and Biopatch. Post procedure assessment: normal and unchanged  Patient tolerated the procedure well with no immediate complications. Additional procedure comments: Performed by Orland Mustard, SRNA under supervision of CRNA.

## 2021-09-06 NOTE — Anesthesia Preprocedure Evaluation (Signed)
Anesthesia Evaluation  Patient identified by MRN, date of birth, ID band Patient awake    Reviewed: Allergy & Precautions, NPO status , Patient's Chart, lab work & pertinent test results  History of Anesthesia Complications Negative for: history of anesthetic complications  Airway Mallampati: III  TM Distance: >3 FB Neck ROM: Full    Dental  (+) Dental Advisory Given, Teeth Intact   Pulmonary former smoker,    breath sounds clear to auscultation       Cardiovascular hypertension, Pt. on medications (-) angina(-) Past MI and (-) CHF  Rhythm:Regular     Neuro/Psych negative psych ROS   GI/Hepatic GERD  Medicated and Controlled,  Endo/Other    Renal/GU Renal InsufficiencyRenal diseaseLab Results      Component                Value               Date                      CREATININE               1.46 (H)            08/31/2021                Musculoskeletal  (+) Arthritis ,   Abdominal   Peds  Hematology negative hematology ROS (+) Lab Results      Component                Value               Date                      WBC                      22.2 (H)            08/31/2021                HGB                      14.2                08/31/2021                HCT                      43.1                08/31/2021                MCV                      82.6                08/31/2021                PLT                      304                 08/31/2021              Anesthesia Other Findings   Reproductive/Obstetrics  Anesthesia Physical Anesthesia Plan  ASA: 3  Anesthesia Plan: General   Post-op Pain Management: Ofirmev IV (intra-op)*   Induction: Intravenous  PONV Risk Score and Plan: 2 and Ondansetron, Dexamethasone, Propofol infusion and TIVA  Airway Management Planned: Oral ETT  Additional Equipment: Arterial line  Intra-op Plan:   Post-operative  Plan: Extubation in OR  Informed Consent: I have reviewed the patients History and Physical, chart, labs and discussed the procedure including the risks, benefits and alternatives for the proposed anesthesia with the patient or authorized representative who has indicated his/her understanding and acceptance.     Dental advisory given  Plan Discussed with: CRNA  Anesthesia Plan Comments:         Anesthesia Quick Evaluation

## 2021-09-06 NOTE — Op Note (Signed)
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 Postoperative diagnosis: Same Procedure: Left frontal temporal craniotomy and gross total resection of meningioma Surgeon: Kristeen Miss First Assistant: Consuella Lose MD Anesthesia: General endotracheal Indications: Jared Shelton is an 81 year old individual who has had a work-up for prostate cancer including a PET scan PET scan showed an unusual area of activity in the cranium this was found to be a meningioma in the left frontal temporal region and MRI demonstrates that the patient has a substantial amount of vasogenic edema related to the tumor and marked shift his wife notes that his behavior has been off over the last number of months and he was advised regarding surgical resection of this lesion.  Procedure: Patient was brought to the operating room placed on table supine position.  After the smooth induction of general endotracheal anesthesia he was placed on a 3 pin headrest and the head was carefully turned to the right side left side of the scalp was prepped after being shaved completely with alcohol and DuraPrep and draped in a sterile fashion.  A standard left frontotemporal scalp flap was drawn out on the scalp and the area of the incision was infiltrated with a total of 20 cc of lidocaine with epinephrine 1-100,000 mixed 50-50 with half percent Marcaine.  The incision was taken down to the galea and the galea was stripped forward temporalis fascia and muscle was opened and also flapped forward.  The area of the keyhole was then isolated at the frontal portion of the temporalis insertion.  A singular bur hole was created in this region and the underlying dura was then stripped from the underlying bone and a craniotome was inserted here and a standard left frontal temporal flap was raised.  At the base along the lateral sphenoid wing a drill was used to score and break the outer  cortex of bone this also yielded substantial hemorrhage from the middle meningeal vessels coming up through this region this was packed that first and then the flap was raised the tumor itself was densely adherent to the bone and this required stripping of the dura which fractured and open several areas.  Nonetheless the underlying brain was quite protected.  The bone flap was removed and then the hemostasis around the bleeding dural edges was obtained attack of the tumor was undertaken from the lateral edge of the sphenoid where the middle meningeal vessels were identified cauterized and packed the bleeding edge of bone was also packed off.  Then by opening the dura around the outer perimetry the edges of the tumor could gently be dissected free from the surrounding adhesions with the bone the sylvian fissure was opened in this region and this allowed for good egress of spinal fluid to allow for good relaxation of the brain gradually the tumor could be mobilized slightly from the surrounding brain and elevated ever so slightly because of the mass of the tumor and anterior wedge of the tumor from under the frontal bone was then resected and this allowed for more room to gently retract the tumor away from the brain and gradually bring it out the rest of the tumor then was dissected free from the meningeal and peel attachments and removed en bloc from the lateral edge of the sphenoid.  Further inspection of the remaining tissues yielded very little and of any tumor attachments.  These were all cauterized down once the tumor was removed then closure of the dura  was undertaken by closing the remaining flaps in a loosely approximated fashion then a large sheet of DuraGen was placed over the openings that remained in 3 separate pieces to cover and provide complete dural closure.  Once this was achieved the bone flap was reinserted after using 3 dog bone plates and a singular bur hole cover over the anterior bur hole this  was secured to the cranium.  Hemostasis was noted to be good and at this point the scalp flap was retracted back to the original position and the temporalis fascia was closed with 2-0 Vicryl in interrupted fashion 2-0 Vicryl was used in the galea and surgical staples were used in the skin blood loss for the procedure was estimated about 250 cc.  A dry sterile dressing was applied and the patient's head was wrapped with a Kerlix after being removed from the 3 pin headrest.  The patient was then extubated and taken to the recovery room

## 2021-09-06 NOTE — Anesthesia Procedure Notes (Signed)
Procedure Name: Intubation Date/Time: 09/06/2021 12:58 PM  Performed by: Claris Che, CRNAPre-anesthesia Checklist: Patient identified, Emergency Drugs available, Suction available and Patient being monitored Patient Re-evaluated:Patient Re-evaluated prior to induction Oxygen Delivery Method: Circle System Utilized Preoxygenation: Pre-oxygenation with 100% oxygen Induction Type: IV induction Ventilation: Mask ventilation without difficulty Laryngoscope Size: Mac and 4 Grade View: Grade I Tube type: Oral Tube size: 7.5 mm Number of attempts: 1 Airway Equipment and Method: Stylet and Oral airway Placement Confirmation: ETT inserted through vocal cords under direct vision, positive ETCO2 and breath sounds checked- equal and bilateral Secured at: 23 cm Tube secured with: Tape Dental Injury: Teeth and Oropharynx as per pre-operative assessment

## 2021-09-06 NOTE — H&P (Signed)
CHIEF COMPLAINT: Incidental meningioma.  HISTORY OF PRESENT ILLNESS: Mr. Jared Shelton is an 81 year old left-handed individual, who tells me that he is here because he had a test and he is told he has a meningioma.  Apparently, the patient had a PET scan, and the PET take up showed a lesion in the left frontal lobe.  An MRI of the brain was performed, and this demonstrates that the patient has a 7 cm meningioma based on the lateral sphenoid wing with significant mass effect in the frontal lobe and posteriorly towards the temporal lobe.  He has a significant left-to-right shift with herniation of the cingulate gyrus under the falx. He has not been aware of any particular problems, and he notes that he is quite surprised to find out that this was the situation.  However, in talking to his wife, the patient's wife notes that she has been concerned about him being somewhat more forgetful.  The patient himself notes that he has not had quite the energy that he had and was suspicious that this was because he had turned 80.  He also notes that he had been having a little difficulty with his gait, but this has been fairly minor.  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  He denies any history of headache or blurry or double vision.  PAST MEDICAL HISTORY: Reveals that his general health has been quite good.  He is followed by Dr. Serita Grammes.  MEDICATIONS: Include Amlodipine, Famotidine, Meloxicam, Aspirin, and MiraLAX.  REVIEW OF SYSTEMS: Notable for some high cholesterol, difficulty with starting or stopping urinary stream, some arthritis, kidney stones, joint pain and swelling.  PHYSICAL EXAMINATION: I note that the patient has no evidence of a cortical drift.  His face is symmetric.  Tongue and uvula are in the midline.  Sclerae and conjunctivae are clear.  Extraocular  movements appear to be intact.  His station and gait are intact, although I note that there is a stiffness in the movement of the right upper and right lower extremity in his gait.  He does not have any brisk hyperreflexia.  He does have a slight internal rotation when testing strength in his upper extremities.  The internal rotation is on the left side compared to the right.  IMPRESSION: The patient has evidence of a large left frontal meningioma.  He has significant vasogenic edema surrounding this with a substantial shift from left to right.  I believe that this lesion needs to be removed from the patient's head and I discussed this with him and his wife today.  I noted that some concern exists about the vascularity of this lesion, and I would like to discuss this with my endovascular specialist to see if we should consider an embolization procedure before surgery.  This would just help to make the surgery a bit easier if it is something deemed to be appropriate. At this time, I felt that it would be appropriate to start him on some Decadron 2 mg twice a day as he does not have a history of diabetes, and given the degree of vasogenic edema, I noted that this would lessen some of the swelling and decrease some of the risk that I have concerns about, namely the risk of an acute stroke from vascular compression. That given the situation, we will plan on scheduling the surgery sometime in the  next couple of weeks.  The patient is to have a cystoscopy procedure here in the next week as he has a large kidney stone that needs to be removed and it would behove [place] to get that done before considering the craniotomy.  We will endeavor though, to schedule the surgery as soon as possible after that.  Questions were answered and we will move forward with this patient's care.

## 2021-09-06 NOTE — Transfer of Care (Signed)
Immediate Anesthesia Transfer of Care Note  Patient: Jared Shelton.  Procedure(s) Performed: Left Frontal temporal craniotomy for resection of meningioma (Left)  Patient Location: PACU  Anesthesia Type:General  Level of Consciousness: drowsy  Airway & Oxygen Therapy: Patient Spontanous Breathing  Post-op Assessment: Report given to RN and Post -op Vital signs reviewed and stable  Post vital signs: Reviewed and stable  Last Vitals:  Vitals Value Taken Time  BP 158/76 09/06/21 1541  Temp    Pulse 58 09/06/21 1547  Resp 17 09/06/21 1547  SpO2 93 % 09/06/21 1547  Vitals shown include unvalidated device data.  Last Pain:  Vitals:   09/06/21 1541  TempSrc:   PainSc: Asleep      Patients Stated Pain Goal: 3 (78/93/81 0175)  Complications: No notable events documented.

## 2021-09-07 ENCOUNTER — Inpatient Hospital Stay (HOSPITAL_COMMUNITY): Payer: PPO

## 2021-09-07 ENCOUNTER — Encounter (HOSPITAL_COMMUNITY): Payer: Self-pay | Admitting: Neurological Surgery

## 2021-09-07 LAB — CBC
HCT: 38.5 % — ABNORMAL LOW (ref 39.0–52.0)
Hemoglobin: 13.4 g/dL (ref 13.0–17.0)
MCH: 28.1 pg (ref 26.0–34.0)
MCHC: 34.8 g/dL (ref 30.0–36.0)
MCV: 80.7 fL (ref 80.0–100.0)
Platelets: 239 10*3/uL (ref 150–400)
RBC: 4.77 MIL/uL (ref 4.22–5.81)
RDW: 14.6 % (ref 11.5–15.5)
WBC: 33.6 10*3/uL — ABNORMAL HIGH (ref 4.0–10.5)
nRBC: 0 % (ref 0.0–0.2)

## 2021-09-07 MED ORDER — PANTOPRAZOLE SODIUM 40 MG PO TBEC
40.0000 mg | DELAYED_RELEASE_TABLET | Freq: Every day | ORAL | Status: DC
  Administered 2021-09-07 – 2021-09-08 (×2): 40 mg via ORAL
  Filled 2021-09-07 (×2): qty 1

## 2021-09-07 MED ORDER — LEVETIRACETAM 500 MG PO TABS
500.0000 mg | ORAL_TABLET | Freq: Two times a day (BID) | ORAL | Status: DC
  Administered 2021-09-07 – 2021-09-09 (×4): 500 mg via ORAL
  Filled 2021-09-07 (×4): qty 1

## 2021-09-07 MED ORDER — CHLORHEXIDINE GLUCONATE CLOTH 2 % EX PADS
6.0000 | MEDICATED_PAD | Freq: Every day | CUTANEOUS | Status: DC
  Administered 2021-09-07: 6 via TOPICAL

## 2021-09-07 NOTE — Evaluation (Addendum)
Physical Therapy Evaluation Patient Details Name: Jared Shelton. MRN: [id number] DOB: 05/02/1940 Today's Date: 09/07/2021  History of Present Illness  Pt is a 81 y.o. 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 Pt with word finding difficulties post op. CT head showing acute 3.5 cm intraparenchymal hemorrhage in the left frontal lobe at  the site of recent meningioma resection. Significant PMH: none.  Clinical Impression  Pt admitted s/p procedure listed above. Pt demonstrates global aphasia post op; not able to follow commands, but can perform automatic motor tasks. Pt displaying word salad and unable to identify objects and needs cues for focusing attention. Pt ambulating 100 ft with no assistive device at a min guard level. Anticipate he will progress quickly from a physical/mobility standpoint. Will continue to follow acutely, but do not anticipate need for PT follow up. Will defer to SLP.     Recommendations for follow up therapy are one component of a multi-disciplinary discharge planning process, led by the attending physician.  Recommendations may be updated based on patient status, additional functional criteria and insurance authorization.  Follow Up Recommendations No PT follow up    Assistance Recommended at Discharge Frequent or constant Supervision/Assistance  Patient can return home with the following  A little help with walking and/or transfers;A little help with bathing/dressing/bathroom;Assistance with cooking/housework;Direct supervision/assist for medications management;Direct supervision/assist for financial management;Assist for transportation;Help with stairs or ramp for entrance    Equipment Recommendations None recommended by PT  Recommendations for Other Services       Functional Status Assessment Patient has had a recent decline in their functional status and demonstrates the ability to make significant improvements in function in a  reasonable and predictable amount of time.     Precautions / Restrictions Precautions Precautions: Fall;Other (comment) Precaution Comments: global aphasia Restrictions Weight Bearing Restrictions: No      Mobility  Bed Mobility Overal bed mobility: Needs Assistance Bed Mobility: Supine to Sit     Supine to sit: Supervision     General bed mobility comments: supervision for line management    Transfers Overall transfer level: Needs assistance Equipment used: None Transfers: Sit to/from Stand Sit to Stand: Min guard           General transfer comment: Min guard for safety    Ambulation/Gait Ambulation/Gait assistance: Min guard, +2 safety/equipment Gait Distance (Feet): 100 Feet Assistive device: None Gait Pattern/deviations: Step-through pattern, Decreased stride length       General Gait Details: Overall slow and steady pace, light tactile cues for navigation, min guard for safety (+2 for lines)  Stairs            Wheelchair Mobility    Modified Rankin (Stroke Patients Only)       Balance Overall balance assessment: Mild deficits observed, not formally tested                                           Pertinent Vitals/Pain Pain Assessment Pain Assessment: Faces Faces Pain Scale: Hurts a little bit Pain Location: head Pain Descriptors / Indicators: Grimacing Pain Intervention(s): Monitored during session    Home Living Family/patient expects to be discharged to:: Private residence Living Arrangements: Spouse/significant other Available Help at Discharge: Family;Available 24 hours/day Type of Home: House Home Access: Stairs to enter   [employer] of Steps: 2   Home Layout: One level  Home Equipment: None      Prior Function Prior Level of Function : Independent/Modified Independent             Mobility Comments: Enjoys riding on his tractor, working outside, 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 Dominance    Dominant Hand: Left    Extremity/Trunk Assessment   Upper Extremity Assessment Upper Extremity Assessment: Defer to OT evaluation    Lower Extremity Assessment Lower Extremity Assessment: Overall WFL for tasks assessed    Cervical / Trunk Assessment Cervical / Trunk Assessment: Normal  Communication   Communication: Receptive difficulties;Expressive difficulties  Cognition Arousal/Alertness: Awake/alert Behavior During Therapy: WFL for tasks assessed/performed Overall Cognitive Status: Difficult to assess Area of Impairment: Following commands, Attention                   Current Attention Level: Sustained   Following Commands: Follows one step commands inconsistently       General Comments: Pt following only automatic tasks at this time, otherwise not following commands. Word salad; cannot identify or name objects. Needs cues for directing attention        General Comments      Exercises     Assessment/Plan    PT Assessment Patient needs continued PT services  PT Problem List Decreased balance;Decreased mobility;Decreased cognition       PT Treatment Interventions Gait training;Stair training;Functional mobility training;Therapeutic activities;Therapeutic exercise;Balance training;Patient/family education    PT Goals (Current goals can be found in the Care Plan section)  Acute Rehab PT Goals Patient Stated Goal: pt wife would like him to return to baseline PT Goal Formulation: With patient/family Time For Goal Achievement: 09/21/21 Potential to Achieve Goals: Good    Frequency Min 3X/week     Co-evaluation               AM-PAC PT "6 Clicks" Mobility  Outcome Measure Help needed turning from your back to your side while in a flat bed without using bedrails?: A Little Help needed moving from lying on your back to sitting on the side of a flat bed without using bedrails?: A Little Help needed moving to and from a bed to a chair (including a  wheelchair)?: A Little Help needed standing up from a chair using your arms (e.g., wheelchair or bedside chair)?: A Little Help needed to walk in hospital room?: A Little Help needed climbing 3-5 steps with a railing? : A Little 6 Click Score: 18    End of Session Equipment Utilized During Treatment: Gait belt Activity Tolerance: Patient tolerated treatment well Patient left: in chair;with call bell/phone within reach;with chair alarm set;with family/visitor present Nurse Communication: Mobility status PT Visit Diagnosis: Unsteadiness on feet (R26.81)    Time: 8099-8338 PT Time Calculation (min) (ACUTE ONLY): 27 min   Charges:   PT Evaluation $PT Eval Low Complexity: 1 Low PT Treatments $Therapeutic Activity: 8-22 mins        Wyona Almas, PT, DPT Acute Rehabilitation Services Office [phone]   Deno Etienne 09/07/2021, 5:18 PM

## 2021-09-07 NOTE — Anesthesia Postprocedure Evaluation (Signed)
Anesthesia Post Note  Patient: Jared Shelton.  Procedure(s) Performed: Left Frontal temporal craniotomy for resection of meningioma (Left)     Patient location during evaluation: PACU Anesthesia Type: General Level of consciousness: patient cooperative and awake Pain management: pain level controlled Vital Signs Assessment: post-procedure vital signs reviewed and stable Respiratory status: spontaneous breathing, nonlabored ventilation, respiratory function stable and patient connected to nasal cannula oxygen Cardiovascular status: blood pressure returned to baseline and stable Postop Assessment: no apparent nausea or vomiting Anesthetic complications: no   No notable events documented.  Last Vitals:  Vitals:   09/07/21 1145 09/07/21 1200  BP:  (!) 109/54  Pulse: 66 66  Resp: 17 20  Temp:  37.2 C  SpO2: 93% 93%    Last Pain:  Vitals:   09/07/21 1200  TempSrc: Axillary  PainSc:                  Jalayne Ganesh

## 2021-09-07 NOTE — Progress Notes (Signed)
Patient ID: Jared Couper., male   DOB: 10-Nov-1940, 81 y.o.   MRN: [id number] Vital signs are stable Patient is having normal blood pressure by cuff but his A-line pressure is exceedingly high requiring max dose of blood pressure medication.  We will DC the A-line.  He is having some word finding difficulties.  I will order a CT scan of the brain today.  This should gradually resolve over time.

## 2021-09-08 LAB — SURGICAL PATHOLOGY

## 2021-09-08 NOTE — Progress Notes (Signed)
Transferred to [facility]. Report to [facility]. Phone, black bag, white belonging bag with pt. Pt wife notified of transfer.

## 2021-09-08 NOTE — Evaluation (Signed)
Occupational Therapy Evaluation Patient Details Name: Jared Shelton. MRN: [id number] DOB: 06-26-1940 Today's Date: 09/08/2021   History of Present Illness 81 y.o. 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 Pt with word finding difficulties post op. CT head showing acute 3.5 cm intraparenchymal hemorrhage in the left frontal lobe at  the site of recent meningioma resection. Significant PMH: none.   Clinical Impression   PTA, pt was living with his wife and independent. Pt currently requiring Min A for ADLs and functional mobility with Max cues throughout for sequencing, initiating, and problem solving.  Wife reporting she wants to take him home so she can care for him. Pt would benefit from further acute OT to facilitate safe dc. Recommend dc to home with HHOT for further OT to optimize safety, independence with ADLs, and return to PLOF.      Recommendations for follow up therapy are one component of a multi-disciplinary discharge planning process, led by the attending physician.  Recommendations may be updated based on patient status, additional functional criteria and insurance authorization.   Follow Up Recommendations  Home health OT    Assistance Recommended at Discharge Frequent or constant Supervision/Assistance  Patient can return home with the following      Functional Status Assessment  Patient has had a recent decline in their functional status and demonstrates the ability to make significant improvements in function in a reasonable and predictable amount of time.  Equipment Recommendations  Tub/shower seat    Recommendations for Other Services       Precautions / Restrictions Precautions Precautions: Fall;Other (comment) Precaution Comments: global aphasia Restrictions Weight Bearing Restrictions: No      Mobility Bed Mobility Overal bed mobility: Needs Assistance Bed Mobility: Supine to Sit, Sit to Supine     Supine to sit:  Supervision Sit to supine: Supervision   General bed mobility comments: Supervision for safety    Transfers Overall transfer level: Needs assistance Equipment used: None Transfers: Sit to/from Stand Sit to Stand: Min guard           General transfer comment: Min guard for safety      Balance Overall balance assessment: Mild deficits observed, not formally tested                                         ADL either performed or assessed with clinical judgement   ADL Overall ADL's : Needs assistance/impaired Eating/Feeding: Set up;Sitting   Grooming: Wash/dry hands;Minimal assistance;Standing;Cueing for sequencing Grooming Details (indicate cue type and reason): Min Guard-Min A for posterior lean. Max cues thorughout Upper Body Bathing: Minimal assistance;Sitting   Lower Body Bathing: Minimal assistance;Sit to/from stand   Upper Body Dressing : Minimal assistance;Sitting   Lower Body Dressing: Minimal assistance;Sit to/from stand   Toilet Transfer: Ambulation;Min guard           Functional mobility during ADLs: Minimal assistance General ADL Comments: Pt presenting with LOB during mobiltiy in bathroom and poor cognition during ADLs. Decreased safety. Max cues throughout     Vision Baseline Vision/History: 1 Wears glasses       Perception     Praxis      Pertinent Vitals/Pain Pain Assessment Pain Assessment: Faces Faces Pain Scale: Hurts a little bit Pain Location: head Pain Descriptors / Indicators: Discomfort Pain Intervention(s): Monitored during session, Limited activity within patient's tolerance, Repositioned  Hand Dominance Left   Extremity/Trunk Assessment Upper Extremity Assessment Upper Extremity Assessment: Difficult to assess due to impaired cognition (unable to assess due to cognitive deficits; using functionally during ADLs)   Lower Extremity Assessment Lower Extremity Assessment: Defer to PT evaluation   Cervical  / Trunk Assessment Cervical / Trunk Assessment: Normal   Communication Communication Communication: Receptive difficulties;Expressive difficulties   Cognition Arousal/Alertness: Awake/alert Behavior During Therapy: WFL for tasks assessed/performed Overall Cognitive Status: Impaired/Different from baseline Area of Impairment: Attention, Memory, Following commands, Safety/judgement, Awareness, Problem solving                   Current Attention Level: Sustained Memory: Decreased short-term memory Following Commands: Follows one step commands inconsistently Safety/Judgement: Decreased awareness of safety, Decreased awareness of deficits Awareness: Intellectual Problem Solving: Slow processing, Difficulty sequencing, Requires verbal cues, Requires tactile cues, Decreased initiation General Comments: Pt with difficulty performing ADLs. Requiring cues throughout all ADLs to initate, sequence, and then problem solve next steps. Pt presenitng wiht word salad and difficulty answering questions.     General Comments  VSS    Exercises     Shoulder Instructions      Home Living Family/patient expects to be discharged to:: Private residence Living Arrangements: Spouse/significant other Available Help at Discharge: Family;Available 24 hours/day Type of Home: House Home Access: Stairs to enter [employer] of Steps: 2   Home Layout: One level     Bathroom Shower/Tub: Teacher, early years/pre: Standard     Home Equipment: None      Lives With: Spouse    Prior Functioning/Environment Prior Level of Function : Independent/Modified Independent             Mobility Comments: No DME needs ADLs Comments: Enjoys riding on his tractor, working outside, has 2 dogs        OT Problem List: Decreased strength;Decreased range of motion;Decreased activity tolerance;Impaired balance (sitting and/or standing);Decreased knowledge of use of DME or AE;Decreased  knowledge of precautions;Decreased cognition      OT Treatment/Interventions: Self-care/ADL training;Therapeutic exercise;DME and/or AE instruction;Energy conservation;Therapeutic activities;Cognitive remediation/compensation;Balance training;Patient/family education    OT Goals(Current goals can be found in the care plan section) Acute Rehab OT Goals Patient Stated Goal: "Go home and take care of him" wife OT Goal Formulation: With patient/family Time For Goal Achievement: 09/22/21 Potential to Achieve Goals: Good  OT Frequency: Min 3X/week    Co-evaluation              AM-PAC OT "6 Clicks" Daily Activity     Outcome Measure Help from another person eating meals?: A Little Help from another person taking care of personal grooming?: A Little Help from another person toileting, which includes using toliet, bedpan, or urinal?: A Little Help from another person bathing (including washing, rinsing, drying)?: A Little Help from another person to put on and taking off regular upper body clothing?: A Little Help from another person to put on and taking off regular lower body clothing?: A Little 6 Click Score: 18   End of Session Nurse Communication: Mobility status  Activity Tolerance: Patient tolerated treatment well Patient left: with call bell/phone within reach;in chair  OT Visit Diagnosis: Muscle weakness (generalized) (M62.81);Unsteadiness on feet (R26.81);Other abnormalities of gait and mobility (R26.89)                Time: 6712-4580 OT Time Calculation (min): 15 min Charges:  OT General Charges $OT Visit: 1 Visit OT Evaluation $OT Eval Moderate  Complexity: 1 Mod  Varvara Legault MSOT, OTR/L Acute Rehab Office: [place] 09/08/2021, 6:11 PM

## 2021-09-08 NOTE — Progress Notes (Signed)
Patient ID: Jared Balboa., male   DOB: 1941/01/30, 81 y.o.   MRN: [id number] Vital signs are stable Patient is awake alert speech difficulties are noted Speech therapy started to work with patient however he is mobile and walking without difficulty.  His dressing remains intact.  I have reviewed the CT scan from yesterday which shows that there is a small area of some hemorrhage at the base of the tumor bed which may be within the cerebrum itself.  This likely aggravates his speech deficit.  This should resolve on its own.  If he is mobilizing well tomorrow we may consider discharge.

## 2021-09-08 NOTE — Plan of Care (Signed)
  Problem: Education: Goal: Knowledge of the prescribed therapeutic regimen will improve 09/08/2021 2238 by Gwyndolyn Kaufman, RN Outcome: Progressing 09/08/2021 2238 by Gwyndolyn Kaufman, RN Outcome: Progressing   Problem: Clinical Measurements: Goal: Usual level of consciousness will be regained or maintained. 09/08/2021 2238 by Gwyndolyn Kaufman, RN Outcome: Progressing 09/08/2021 2238 by Gwyndolyn Kaufman, RN Outcome: Progressing Goal: Neurologic status will improve 09/08/2021 2238 by Gwyndolyn Kaufman, RN Outcome: Progressing 09/08/2021 2238 by Gwyndolyn Kaufman, RN Outcome: Progressing Goal: Ability to maintain intracranial pressure will improve 09/08/2021 2238 by Gwyndolyn Kaufman, RN Outcome: Progressing 09/08/2021 2238 by Gwyndolyn Kaufman, RN Outcome: Progressing   Problem: Skin Integrity: Goal: Demonstration of wound healing without infection will improve 09/08/2021 2238 by Gwyndolyn Kaufman, RN Outcome: Progressing 09/08/2021 2238 by Gwyndolyn Kaufman, RN Outcome: Progressing   Problem: Education: Goal: Knowledge of General Education information will improve Description: Including pain rating scale, medication(s)/side effects and non-pharmacologic comfort measures 09/08/2021 2238 by Gwyndolyn Kaufman, RN Outcome: Progressing 09/08/2021 2238 by Gwyndolyn Kaufman, RN Outcome: Progressing   Problem: Health Behavior/Discharge Planning: Goal: Ability to manage health-related needs will improve 09/08/2021 2238 by Gwyndolyn Kaufman, RN Outcome: Progressing 09/08/2021 2238 by Gwyndolyn Kaufman, RN Outcome: Progressing   Problem: Clinical Measurements: Goal: Ability to maintain clinical measurements within normal limits will improve 09/08/2021 2238 by Gwyndolyn Kaufman, RN Outcome: Progressing 09/08/2021 2238 by Gwyndolyn Kaufman, RN Outcome: Progressing Goal: Will remain free from infection 09/08/2021 2238 by Gwyndolyn Kaufman, RN Outcome: Progressing 09/08/2021 2238 by Gwyndolyn Kaufman,  RN Outcome: Progressing Goal: Diagnostic test results will improve 09/08/2021 2238 by Gwyndolyn Kaufman, RN Outcome: Progressing 09/08/2021 2238 by Gwyndolyn Kaufman, RN Outcome: Progressing Goal: Respiratory complications will improve 09/08/2021 2238 by Gwyndolyn Kaufman, RN Outcome: Progressing 09/08/2021 2238 by Gwyndolyn Kaufman, RN Outcome: Progressing Goal: Cardiovascular complication will be avoided 09/08/2021 2238 by Gwyndolyn Kaufman, RN Outcome: Progressing 09/08/2021 2238 by Gwyndolyn Kaufman, RN Outcome: Progressing   Problem: Activity: Goal: Risk for activity intolerance will decrease 09/08/2021 2238 by Gwyndolyn Kaufman, RN Outcome: Progressing 09/08/2021 2238 by Gwyndolyn Kaufman, RN Outcome: Progressing   Problem: Nutrition: Goal: Adequate nutrition will be maintained 09/08/2021 2238 by Gwyndolyn Kaufman, RN Outcome: Progressing 09/08/2021 2238 by Gwyndolyn Kaufman, RN Outcome: Progressing   Problem: Coping: Goal: Level of anxiety will decrease 09/08/2021 2238 by Gwyndolyn Kaufman, RN Outcome: Progressing 09/08/2021 2238 by Gwyndolyn Kaufman, RN Outcome: Progressing   Problem: Elimination: Goal: Will not experience complications related to bowel motility 09/08/2021 2238 by Gwyndolyn Kaufman, RN Outcome: Progressing 09/08/2021 2238 by Gwyndolyn Kaufman, RN Outcome: Progressing Goal: Will not experience complications related to urinary retention 09/08/2021 2238 by Gwyndolyn Kaufman, RN Outcome: Progressing 09/08/2021 2238 by Gwyndolyn Kaufman, RN Outcome: Progressing   Problem: Pain Managment: Goal: General experience of comfort will improve 09/08/2021 2238 by Gwyndolyn Kaufman, RN Outcome: Progressing 09/08/2021 2238 by Gwyndolyn Kaufman, RN Outcome: Progressing   Problem: Safety: Goal: Ability to remain free from injury will improve 09/08/2021 2238 by Gwyndolyn Kaufman, RN Outcome: Progressing 09/08/2021 2238 by Gwyndolyn Kaufman, RN Outcome: Progressing   Problem: Skin  Integrity: Goal: Risk for impaired skin integrity will decrease 09/08/2021 2238 by Gwyndolyn Kaufman, RN Outcome: Progressing 09/08/2021 2238 by Gwyndolyn Kaufman, RN Outcome: Progressing

## 2021-09-08 NOTE — Progress Notes (Signed)
  Transition of Care [facility]) Screening Note   Patient Details  Name: Jared Shelton. Date of Birth: May 08, 1940   Transition of Care [facility]) CM/SW Contact:    Benard Halsted, LCSW Phone Number: 09/08/2021, 10:18 AM    Transition of Care Department [facility]) has reviewed patient and no TOC needs have been identified at this time. We will continue to monitor patient advancement through interdisciplinary progression rounds. If new patient transition needs arise, please place a TOC consult.

## 2021-09-08 NOTE — Evaluation (Signed)
Speech Language Pathology Evaluation Patient Details Name: Jared Shelton. 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[place]) 09/06/2021   Elevated PSA 07/13/2021   Prostate nodule 07/13/2021   Hiccoughs 07/13/2021   BPH with obstruction/lower urinary tract symptoms 07/12/2021   Past Medical History:  Past Medical History:  Diagnosis Date   Arthritis    BPH (benign prostatic hyperplasia)    Elevated PSA    GERD (gastroesophageal reflux disease)    History of kidney stones    Hypertension    Meningioma ([place])    frontal temporal, surgery scheduled 09/06/2021   Skin cancer    Past Surgical History:  Past Surgical History:  Procedure Laterality Date   CRANIOTOMY Left 09/06/2021   Procedure: Left Frontal temporal craniotomy for resection of meningioma;  Surgeon: Kristeen Miss, MD;  Location: [place];  Service: Neurosurgery;  Laterality: Left;   CYST REMOVAL HAND     CYSTOSCOPY/URETEROSCOPY/HOLMIUM LASER/STENT PLACEMENT Left 08/26/2021   Procedure: CYSTOSCOPY; BASKET STONE EXTRACTION;  Surgeon: Irine Seal, MD;  Location: [facility];  Service: Urology;  Laterality: Left;   EYE SURGERY Right    cataract   PROSTATE BIOPSY N/A 07/12/2021   Procedure: BIOPSY TRANSRECTAL ULTRASONIC PROSTATE (TUBP);  Surgeon: Irine Seal, MD;  Location: [facility];  Service: Urology;  Laterality: N/A;  75 MINS FOR CASE   TONSILLECTOMY     TRANSURETHRAL RESECTION OF PROSTATE N/A 07/12/2021   Procedure: TRANSURETHRAL RESECTION OF THE PROSTATE (TURP);  Surgeon: Irine Seal, MD;  Location: [facility];  Service: Urology;  Laterality: N/A;  76 MINS FOR CASE   VASECTOMY     WISDOM TOOTH EXTRACTION     HPI:  Pt is an 81 y.o. male who presented with left frontal meninigoma and is s/p craniotomy 09/06/2021. Pt exhibited word finding  difficulties post-op. CT head 6/14: acute 3.5 cm intraparenchymal hemorrhage in the left frontal lobe at  the site of recent meningioma resection. No significant PMH.   Assessment / Plan / Recommendation Clinical Impression  Pt participated in speech-language evaluation with his wife present. 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 He was able to accurately identify some photos from a field of six. His accuracy during auditory comprehension of simple yes/no questions was solely due to his perseveration on "yes" responses. Verbal output was fluent, but mostly meaningless. He exhibited numerous neologistic errors of which he exhibited reduced awareness. Some meaningful utterances such as "Hey, how are you doing?", "I'm doing alright" and "That sounds good to me!" were noted during social contexts. Pt's wife was educated regarding results and recommendations, as well as strategies to improve auditory comprehension, facilitate communication with the pt, and to reduce the pt's frustration during communication exchanges. She verbalized understanding as well as agreement and was noted to use some of the strategies during the evaluation. Skilled SLP services are clinically indicated at this time.    SLP Assessment  SLP Recommendation/Assessment: Patient needs continued Speech [employer] [facility] SLP Visit Diagnosis: Aphasia (R47.01)    Recommendations for follow up therapy are one component of a multi-disciplinary discharge planning process, led by the attending physician.  Recommendations may be updated based on patient status, additional functional criteria and insurance authorization.    Follow Up Recommendations   (  Continued SLP services at level of care recommended by PT/OT)    Assistance Recommended at Discharge  Frequent or constant Supervision/Assistance  Functional  Status Assessment Patient has had a recent decline in their functional status and demonstrates the ability to make significant improvements in function in a reasonable and predictable amount of time.  Frequency and Duration min 2x/week  2 weeks      SLP Evaluation Cognition  Overall Cognitive Status: Difficult to assess       Comprehension  Auditory Comprehension Overall Auditory Comprehension: Impaired Yes/No Questions: Impaired Basic Immediate Environment Questions:  (2/5) Complex Questions:  (1/5) Commands: Impaired One Step Basic Commands:  (0/4) Conversation: Simple Reading Comprehension Reading Status: Impaired Word level: Impaired    Expression Expression Primary Mode of Expression: Verbal Verbal Expression Overall Verbal Expression: Impaired Initiation: No impairment Automatic Speech: Counting;Day of week (Counting: 0/10; days: 0/7) Level of Generative/Spontaneous Verbalization: Word Repetition: No impairment Naming: No impairment   Oral / Motor  Oral Motor/Sensory Function Overall Oral Motor/Sensory Function: Within functional limits Motor Speech Overall Motor Speech: Appears within functional limits for tasks assessed Respiration: Within functional limits Phonation: Normal Resonance: Within functional limits Articulation: Within functional limitis Intelligibility: Intelligible Motor Planning: Witnin functional limits Motor Speech Errors: Not applicable           Jared Shelton, [place], [place] Office number [phone] Pager [place] 09/08/2021, 10:24 AM

## 2021-09-09 MED ORDER — DEXAMETHASONE 2 MG PO TABS: 2.0000 mg | ORAL_TABLET | Freq: Two times a day (BID) | ORAL | 0 refills | Status: DC

## 2021-09-09 MED ORDER — TRAMADOL HCL 50 MG PO TABS: 50.0000 mg | ORAL_TABLET | Freq: Four times a day (QID) | ORAL | 0 refills | Status: DC | PRN

## 2021-09-09 MED ORDER — LEVETIRACETAM 500 MG PO TABS: 500.0000 mg | ORAL_TABLET | Freq: Two times a day (BID) | ORAL | 3 refills | Status: DC

## 2021-09-09 NOTE — TOC Transition Note (Signed)
Transition of Care [facility]) - CM/SW Discharge Note   Patient Details  Name: Jared Shelton. MRN: [id number] Date of Birth: 30-Mar-1940  Transition of [facility]) CM/SW Contact:  Angelita Ingles, RN Phone [phone]  09/09/2021, 2:56 PM   Clinical Narrative:    Patient discharging home with home health orders patient was previously arranged with [facility]. Cm called Amy with [facility] to make aware of patients discharge and therapy orders. No other needs noted at this time.          Patient Goals and CMS Choice        Discharge Placement                       Discharge Plan and Services                                     Social Determinants of Health (SDOH) Interventions     Readmission Risk Interventions     No data to display

## 2021-09-09 NOTE — Progress Notes (Signed)
Occupational Therapy Treatment Patient Details Name: Jared Shelton. MRN: [id number] DOB: 04/20/1940 Today's Date: 09/09/2021   History of present illness 81 y.o. 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 Pt with word finding difficulties post op. CT head showing acute 3.5 cm intraparenchymal hemorrhage in the left frontal lobe at  the site of recent meningioma resection. Significant PMH: none.   OT comments  Pt progressing. Pt following ~75% of commands overall requiring repeated and increased time in between tasks.  Pt performing bed mobility at EOB to progress to standing and walking in room with minguardA. Pt standing at sink with minguardA for grooming following 75% of 1 step commands. He took several attempts and required demonstration for LB dressing with minA to start task. Pt kept pulling sock tighter instead of taking off. Pt would greatly benefit from continued OT skilled services. OT following acutely.   Recommendations for follow up therapy are one component of a multi-disciplinary discharge planning process, led by the attending physician.  Recommendations may be updated based on patient status, additional functional criteria and insurance authorization.    Follow Up Recommendations  Home health OT    Assistance Recommended at Discharge Frequent or constant Supervision/Assistance  Patient can return home with the following  A little help with walking and/or transfers;A little help with bathing/dressing/bathroom;Assistance with cooking/housework   Equipment Recommendations  Tub/shower seat    Recommendations for Other Services      Precautions / Restrictions Precautions Precautions: Fall;Other (comment) Precaution Comments: global aphasia Restrictions Weight Bearing Restrictions: No       Mobility Bed Mobility Overal bed mobility: Needs Assistance Bed Mobility: Supine to Sit     Supine to sit: Supervision Sit to supine:  Supervision   General bed mobility comments: Supervision for safety    Transfers Overall transfer level: Needs assistance Equipment used: None Transfers: Sit to/from Stand Sit to Stand: Min guard           General transfer comment: Min guard for safety     Balance Overall balance assessment: Needs assistance Sitting-balance support: No upper extremity supported, Feet supported Sitting balance-Leahy Scale: Good     Standing balance support: No upper extremity supported Standing balance-Leahy Scale: Good                             ADL either performed or assessed with clinical judgement   ADL Overall ADL's : Needs assistance/impaired Eating/Feeding: Set up;Sitting   Grooming: Wash/dry hands;Wash/dry face;Oral care;Min guard;Standing;Cueing for safety Grooming Details (indicate cue type and reason): supervisionA to [employer] overall                 Toilet Transfer: Ambulation;Min Psychiatric nurse Details (indicate cue type and reason): stood for toileting         Functional mobility during ADLs: Min guard;Cueing for safety;Cueing for sequencing General ADL Comments: Pt performing bed mobility at EOB to progress to standing and walking in room with minguardA. Pt standing at sink with minguardA for grooming following 75% of 1 step commands. He took several attempts and required demonstration for LB dressing.    Extremity/Trunk Assessment Upper Extremity Assessment Upper Extremity Assessment: Overall WFL for tasks assessed   Lower Extremity Assessment Lower Extremity Assessment: Defer to PT evaluation        Vision   Vision Assessment?: No apparent visual deficits Additional Comments: scanning environment, not tripping on cords and able to use  mirror for accuracy   Perception     Praxis      Cognition Arousal/Alertness: Awake/alert Behavior During Therapy: WFL for tasks assessed/performed Overall Cognitive Status: Difficult to  assess                                 General Comments: Pt following ~75% of commands overall requiring repeated and increased time in between tasks. Pt following mostly 1 step commands and attempting 2 step commands regarding ADL tasks at sink.        Exercises      Shoulder Instructions       General Comments VSS; spouse in room.    Pertinent Vitals/ Pain       Pain Assessment Pain Assessment: No/denies pain Faces Pain Scale: No hurt Pain Intervention(s): Monitored during session  Home Living                                          Prior Functioning/Environment              Frequency  Min 3X/week        Progress Toward Goals  OT Goals(current goals can now be found in the care plan section)  Progress towards OT goals: Progressing toward goals  Acute Rehab OT Goals Patient Stated Goal: unable to state; wife stated to go home OT Goal Formulation: With patient/family Time For Goal Achievement: 09/22/21 Potential to Achieve Goals: Good  Plan Discharge plan remains appropriate    Co-evaluation                 AM-PAC OT "6 Clicks" Daily Activity     Outcome Measure   Help from another person eating meals?: A Little Help from another person taking care of personal grooming?: A Little Help from another person toileting, which includes using toliet, bedpan, or urinal?: A Little Help from another person bathing (including washing, rinsing, drying)?: A Little Help from another person to put on and taking off regular upper body clothing?: A Little Help from another person to put on and taking off regular lower body clothing?: A Little 6 Click Score: 18    End of Session Equipment Utilized During Treatment: Gait belt  OT Visit Diagnosis: Muscle weakness (generalized) (M62.81);Unsteadiness on feet (R26.81);Other abnormalities of gait and mobility (R26.89)   Activity Tolerance Patient tolerated treatment well   Patient  Left in bed;with call bell/phone within reach;with bed alarm set;with family/visitor present   Nurse Communication Mobility status        Time: 1245-1310 OT Time Calculation (min): 25 min  Charges: OT General Charges $OT Visit: 1 Visit OT Treatments $Self Care/Home Management : 8-22 mins $Therapeutic Activity: 8-22 mins  Jefferey Pica, OTR/L Acute Rehabilitation Services Office: [phone]   [id number] 09/09/2021, 3:00 PM

## 2021-09-09 NOTE — Progress Notes (Signed)
Pt DCd to spouses care at home. Last BP charted and PIV removed. Pt alert and cooperative but still has difficulty with communication secondary to global aphasia.   Jared Shelton spouse Jared Shelton verbalized understanding of discharge instructions/education and understands when Jared Shelton next medications are due and to pick up prescriptions called in to [facility] in [place].   Jared Shelton spouse Jared Shelton will call Monday and confirm follow up appointment  at Dr Clarice Pole office for Thursday 22 June.

## 2021-09-09 NOTE — Progress Notes (Signed)
Physical Therapy Treatment Patient Details Name: Jared Shelton. MRN: [id number] DOB: [birth year] Today's Date: 09/09/2021   History of Present Illness 81 y.o. 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 Pt with word finding difficulties post op. CT head showing acute 3.5 cm intraparenchymal hemorrhage in the left frontal lobe at  the site of recent meningioma resection. Significant PMH: none.    PT Comments    Pt making steady progress with mobility but is not back to his baseline. Updated DC to recommend HHPT to work toward pt returning to his baseline with mobility.    Recommendations for follow up therapy are one component of a multi-disciplinary discharge planning process, led by the attending physician.  Recommendations may be updated based on patient status, additional functional criteria and insurance authorization.  Follow Up Recommendations  Home health PT     Assistance Recommended at Discharge Frequent or constant Supervision/Assistance  Patient can return home with the following Help with stairs or ramp for entrance;A little help with walking and/or transfers   Equipment Recommendations  None recommended by PT    Recommendations for Other Services       Precautions / Restrictions Precautions Precautions: Fall;Other (comment) Precaution Comments: global aphasia Restrictions Weight Bearing Restrictions: No     Mobility  Bed Mobility Overal bed mobility: Needs Assistance Bed Mobility: Supine to Sit     Supine to sit: Supervision     General bed mobility comments: Supervision for safety    Transfers Overall transfer level: Needs assistance Equipment used: None Transfers: Sit to/from Stand Sit to Stand: Min guard           General transfer comment: Min guard for safety    Ambulation/Gait Ambulation/Gait assistance: Min guard, Supervision Gait Distance (Feet): 400 Feet Assistive device: None Gait Pattern/deviations:  Step-through pattern, Decreased stride length Gait velocity: decr Gait velocity interpretation: 1.31 - 2.62 ft/sec, indicative of limited community ambulator   General Gait Details: Initially pt min guard for safety and pt reaching for external support for reassurance. Progressed to supervision and no reaching for external support. Pt with mild listing toward rt initially.   Stairs Stairs: Yes Stairs assistance: Min guard Stair Management: One rail Right, Alternating pattern, Step to pattern, Forwards Number of Stairs: 3 General stair comments: Up with alternating pattern and down with step to pattern. Assist for safety.   Wheelchair Mobility    Modified Rankin (Stroke Patients Only)       Balance Overall balance assessment: Needs assistance Sitting-balance support: No upper extremity supported, Feet supported Sitting balance-Leahy Scale: Good     Standing balance support: No upper extremity supported Standing balance-Leahy Scale: Good                 High Level Balance Comments: Worked on standing balance and reaching actvities in all directions and 360 degree turning            Cognition Arousal/Alertness: Awake/alert Behavior During Therapy: WFL for tasks assessed/performed Overall Cognitive Status: Difficult to assess                                 General Comments: Pt following ~75% of commands for mobility with verbal/tactile cues and gestures. Pt able to say wife's name for the first time. Able to tell me "2 daughters" when asked if children were sons or daughters. Told me "2 dogs" when asked if he had pets.  Incr time for all.        Exercises      General Comments General comments (skin integrity, edema, etc.): VSS      Pertinent Vitals/Pain Pain Assessment Pain Assessment: Faces Faces Pain Scale: No hurt    Home Living                          Prior Function            PT Goals (current goals can now be found  in the care plan section) Progress towards PT goals: Progressing toward goals    Frequency    Min 3X/week      PT Plan Discharge plan needs to be updated    Co-evaluation              AM-PAC PT "6 Clicks" Mobility   Outcome Measure  Help needed turning from your back to your side while in a flat bed without using bedrails?: A Little Help needed moving from lying on your back to sitting on the side of a flat bed without using bedrails?: A Little Help needed moving to and from a bed to a chair (including a wheelchair)?: A Little Help needed standing up from a chair using your arms (e.g., wheelchair or bedside chair)?: A Little Help needed to walk in hospital room?: A Little Help needed climbing 3-5 steps with a railing? : A Little 6 Click Score: 18    End of Session Equipment Utilized During Treatment: Gait belt Activity Tolerance: Patient tolerated treatment well Patient left: in chair;with call bell/phone within reach;with chair alarm set;with family/visitor present Nurse Communication: Mobility status PT Visit Diagnosis: Unsteadiness on feet (R26.81)     Time: 7893-8101 PT Time Calculation (min) (ACUTE ONLY): 34 min  Charges:  $Gait Training: 8-22 mins $Therapeutic Activity: 8-22 mins                     [place] 09/09/2021, 10:55 AM

## 2021-09-09 NOTE — Plan of Care (Signed)
Pts wife and caregiver at [facility] and verbalizes and can teach back all pertinent careplan items as well as pateint education

## 2021-09-09 NOTE — Discharge Summary (Signed)
Physician Discharge Summary  Patient ID: Jared Shelton. MRN: [id number] DOB/AGE: 81-Jul-1942 81 y.o.  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 Principal Problem:   Meningioma, cerebral [facility])   Discharged Condition: good  Hospital Course: Patient was admitted to undergo surgical decompression which he tolerated well.  Postoperatively he has had some dysphagia and this is improving.  He is sent home with therapies.  Consults: None  Significant Diagnostic Studies: None  Treatments: surgery: See op note  Discharge Exam: Blood pressure (!) 150/79, pulse 66, temperature 98.2 F (36.8 C), temperature source Oral, resp. rate 20, height '5\' 8"'$  (1.727 m), weight 88.5 kg, SpO2 98 %. Incision is clean and dry motor function appears intact.  No evidence of drift is noted.  Speech reveals some word finding difficulties.  There is also some mild word salad production  Disposition: Discharge disposition: 01-Home or Self Care       Discharge Instructions     Call MD for:  redness, tenderness, or signs of infection (pain, swelling, redness, odor or green/yellow discharge around incision site)   Complete by: As directed    Call MD for:  severe uncontrolled pain   Complete by: As directed    Call MD for:  temperature >100.4   Complete by: As directed    Diet - low sodium heart healthy   Complete by: As directed    Increase activity slowly   Complete by: As directed    Leave dressing on - Keep it clean, dry, and intact until clinic visit   Complete by: As directed       Allergies as of 09/09/2021   No Known Allergies      Medication List     STOP taking these medications    tamsulosin 0.4 MG Caps capsule Commonly known as: FLOMAX       TAKE these medications    acetaminophen 500 MG tablet Commonly known as:  TYLENOL Take 500-1,000 mg by mouth every 6 (six) hours as needed for headache (pain).   amLODipine 10 MG tablet Commonly known as: NORVASC Take 10 mg by mouth daily.   aspirin EC 81 MG tablet Take 81 mg by mouth daily.   dexamethasone 2 MG tablet Commonly known as: DECADRON Take 1 tablet (2 mg total) by mouth 2 (two) times daily.   famotidine 20 MG tablet Commonly known as: PEPCID Take 20 mg by mouth daily.   fluticasone 50 MCG/ACT nasal spray Commonly known as: FLONASE Place 1 spray into both nostrils daily as needed for allergies.   levETIRAcetam 500 MG tablet Commonly known as: KEPPRA Take 1 tablet (500 mg total) by mouth 2 (two) times daily.   meloxicam 15 MG tablet Commonly known as: MOBIC Take 15 mg by mouth daily.   omeprazole 20 MG capsule Commonly known as: PRILOSEC Take 20 mg by mouth daily.   polyethylene glycol 17 g packet Commonly known as: MIRALAX / GLYCOLAX Take 17 g by mouth daily.   pravastatin 40 MG tablet Commonly known as: PRAVACHOL Take 40 mg by mouth daily.   REFRESH OP Apply 1 drop to eye 2 (two) times daily as needed (dry eyes).   traMADol 50 MG tablet Commonly known as: Ultram Take 1 tablet (50 mg total) by mouth every 6 (six) hours as needed.  Discharge Care Instructions  (From admission, onward)           Start     Ordered   09/09/21 0000  Leave dressing on - Keep it clean, dry, and intact until clinic visit        09/09/21 1340             Signed: Earleen Newport 09/09/2021, 1:40 PM

## 2021-09-11 LAB — TYPE AND SCREEN
ABO/RH(D): O POS
Antibody Screen: NEGATIVE
Unit division: 0
Unit division: 0
Unit division: 0
Unit division: 0

## 2021-09-11 LAB — BPAM RBC
Blood Product Expiration Date: 202306262359
Blood Product Expiration Date: 202306262359
Blood Product Expiration Date: 202306282359
Blood Product Expiration Date: 202307152359
ISSUE DATE / TIME: 202306161640
ISSUE DATE / TIME: 202306170344
Unit Type and Rh: 5100
Unit Type and Rh: 5100
Unit Type and Rh: 5100
Unit Type and Rh: 5100

## 2021-09-16 DIAGNOSIS — Z7982 Long term (current) use of aspirin: Secondary | ICD-10-CM | POA: Diagnosis not present

## 2021-09-16 DIAGNOSIS — W19XXXD Unspecified fall, subsequent encounter: Secondary | ICD-10-CM | POA: Diagnosis not present

## 2021-09-16 DIAGNOSIS — R41841 Cognitive communication deficit: Secondary | ICD-10-CM | POA: Diagnosis not present

## 2021-09-16 DIAGNOSIS — Z48811 Encounter for surgical aftercare following surgery on the nervous system: Secondary | ICD-10-CM | POA: Diagnosis not present

## 2021-09-19 ENCOUNTER — Other Ambulatory Visit: Payer: Self-pay | Admitting: Neurological Surgery

## 2021-09-20 ENCOUNTER — Other Ambulatory Visit: Payer: Self-pay | Admitting: Neurological Surgery

## 2021-09-20 DIAGNOSIS — D320 Benign neoplasm of cerebral meninges: Secondary | ICD-10-CM

## 2021-09-28 DIAGNOSIS — W19XXXD Unspecified fall, subsequent encounter: Secondary | ICD-10-CM | POA: Diagnosis not present

## 2021-09-28 DIAGNOSIS — Z48811 Encounter for surgical aftercare following surgery on the nervous system: Secondary | ICD-10-CM | POA: Diagnosis not present

## 2021-09-28 DIAGNOSIS — R41841 Cognitive communication deficit: Secondary | ICD-10-CM | POA: Diagnosis not present

## 2021-09-28 DIAGNOSIS — Z7982 Long term (current) use of aspirin: Secondary | ICD-10-CM | POA: Diagnosis not present

## 2021-10-03 DIAGNOSIS — R351 Nocturia: Secondary | ICD-10-CM | POA: Diagnosis not present

## 2021-10-03 DIAGNOSIS — R350 Frequency of micturition: Secondary | ICD-10-CM | POA: Diagnosis not present

## 2021-10-03 DIAGNOSIS — R3915 Urgency of urination: Secondary | ICD-10-CM | POA: Diagnosis not present

## 2021-10-05 ENCOUNTER — Ambulatory Visit
Admission: RE | Admit: 2021-10-05 | Discharge: 2021-10-05 | Disposition: A | Discharge status: Home / Self Care | Payer: PPO | Source: Ambulatory Visit | Attending: Neurological Surgery | Admitting: Neurological Surgery

## 2021-10-05 DIAGNOSIS — D320 Benign neoplasm of cerebral meninges: Secondary | ICD-10-CM | POA: Diagnosis not present

## 2021-10-05 DIAGNOSIS — I619 Nontraumatic intracerebral hemorrhage, unspecified: Secondary | ICD-10-CM | POA: Diagnosis not present

## 2021-10-05 DIAGNOSIS — R220 Localized swelling, mass and lump, head: Secondary | ICD-10-CM | POA: Diagnosis not present

## 2021-10-18 NOTE — Progress Notes (Signed)
GU Location of Tumor / Histology: Prostate Ca  If Prostate Cancer, Gleason Score is (4 + 3) and PSA is (5.9 as of 07/2021).   08/02/2021 Dr. Jeffie Pollock NM PET (PSMA) Skull To Mid Thigh CLINICAL DATA:  Prostate cancer. No surgery or other treatments. PSA of 5.9.  FINDINGS: NECK No radiotracer activity in neck lymph nodes.   Tracer avid partially calcified dural-based left frontal mass measures 5.5 x 4.2 cm and a S.U.V. max of 10.4 on 10/04. This creates significant mass effect and on the order of 1.0 cm left to right midline shift.   Incidental CT finding: No cervical adenopathy. Mildly enlarged, heterogeneous thyroid suggests multinodular goiter. No dominant mass.   CHEST No radiotracer accumulation within mediastinal or hilar lymph nodes.  No suspicious pulmonary nodules on the CT scan.  Incidental CT finding: Tiny bilateral pleural effusions, larger on the right. Mild cardiomegaly. Aortic and coronary artery calcification. Tiny hiatal hernia. Esophageal fluid level on 74/4.   ABDOMEN/PELVIS Prostate: Prostatic affinity is slightly eccentric left and centered within the base to mid gland. Example at a S.U.V. max of 31.3.   Right seminal vesicle affinity at a S.U.V. max of 6.1.   Lymph nodes: A left external iliac node measures 6 mm and a S.U.V. max of 2.4 on 187/4   Liver: No evidence of liver metastasis   Incidental CT finding: Normal noncontrast appearance of the liver, spleen, gallbladder, pancreas, adrenal glands, right kidney. Upper pole left renal 4.8 cm low-density lesion is most consistent with a cyst . No f/up imaging recommended.   Although there is no significant hydroureter, a left distal ureteric stone measures 7 mm on 188/4. Abdominal aortic atherosclerosis.  Scattered colonic diverticula. No abdominopelvic adenopathy.   SKELETON No focal  activity to suggest skeletal metastasis.   IMPRESSION: 1. Large volume slightly eccentric left prostatic affinity, presumably site  of dominant primary. 2. Right seminal vesicle tracer affinity, suspicious for tumor involvement. 3. Left external iliac small node with low-level affinity, equivocal. 4. Otherwise, no evidence of metastatic disease. 5. Left frontal intracranial tracer avid mass with significant mass-effect and midline shift. Indeterminate but favored to represent a large meningioma. Recommend further evaluation with pre and post-contrast brain MR. 6. Distal left ureteric 7 mm stone without significant hydroureter. 7. Tiny bilateral pleural effusions. 8. Esophageal air fluid level suggests dysmotility or gastroesophageal reflux. 9. Incidental findings, including: Coronary artery atherosclerosis. Aortic Atherosclerosis (ICD10-I70.0).   Past/Anticipated interventions by urology, if any:    Past/Anticipated interventions by medical oncology, if any:  NA  Weight changes, if any:   No  IPPS:  11 SHIM:  6  Bowel/Bladder complaints, if any:  Urinary symptoms and constipation is on Miralax as needed.  Nausea/Vomiting, if any:  No  Pain issues, if any:  0/10  SAFETY ISSUES: Prior radiation?   No Pacemaker/ICD?  No Possible current pregnancy?  Male Is the patient on methotrexate?  No  Current Complaints / other details:  Need more information on treatment options.  PCP is addressing issue with water edema to lower extremities.

## 2021-10-21 DIAGNOSIS — N3281 Overactive bladder: Secondary | ICD-10-CM | POA: Diagnosis not present

## 2021-10-21 DIAGNOSIS — C61 Malignant neoplasm of prostate: Secondary | ICD-10-CM | POA: Insufficient documentation

## 2021-10-21 NOTE — Progress Notes (Signed)
Radiation Oncology         [phone] ________________________________  Initial Outpatient Consultation  Name: Jared Shelton. MRN: [id number]  Date: 10/24/2021  DOB: 1940-04-30  HE:NIDP, Nathen May, MD  Irine Seal, MD   REFERRING PHYSICIAN: Irine Seal, MD  DIAGNOSIS: 81 y.o. 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    ICD-10-CM   1. 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 Malignant neoplasm of prostate ([place])  C61       HISTORY OF PRESENT ILLNESS: Jared Shelton. is a 81 y.o. male with a diagnosis of prostate cancer. He is an established urology patient with a longstanding history of BPH with bladder outlet obstruction and elevated PSA.  His PSA in January 2021 was 4.63, not on finasteride.  A more recent PSA in 11/13/2020 was 5.86 (11.6 adjusted for finasteride) and he was noted to have prostate asymmetry with right lobe > left lobe and firmness of the right lobe with a 50m nodule.  He had previously been on Myrbetriq, finasteride and Flomax without any improvement in his LUTS and therefore elected to proceed with a TURP and prostate biopsy at the same time.  These procedures were performed on 07/12/2021 under the care and direction of Dr. WJeffie Pollock  The prostate volume measured 55 cc.  Out of 12 core biopsies, 2 were positive.  The maximum Gleason score was 4+3, and this was seen in the right mid and right mid lateral with PNI and adjacent intraductal carcinoma.  Unfortunately, his LUTS did not significantly improve following TURP.  A PSMA PET scan was performed on 08/02/2021 showing a large volume prostatic affinity with possible right seminal vesicle invasion and an equivocal 6 mm left external iliac node with SUV max of 2.4.  Otherwise, there was no evidence of metastatic disease.  He was also incidentally noted to have a 9 mm distal ureteral stone as well as a tracer avid left  frontal mass with mass effect and midline shift, most consistent with a meningioma.  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  He was urgently referred to neurosurgery for evaluation of the meningioma which was felt to take priority over his prostate cancer treatment.  Given his persistent LUTS despite TURP, it was felt that the distal ureteral stone could be contributing and he therefore elected to proceed with ureteroscopy for stone extraction.  This procedure was performed on 08/26/2021 and fortunately, the ureteral stone had already passed into the bladder and was able to be removed easily.  He also underwent a left frontotemporal craniotomy with gross total resection of the meningioma under the care and direction of Dr. EEllene Routeon 09/06/2021.  His LUTS have subsequently improved significantly and he is off Flomax and finasteride at this time.  He has recovered well from his recent surgical procedures and is now felt to have appropriately recovered and is ready to proceed with treatment of the prostate cancer.  The patient reviewed the biopsy and imaging results with his urologist and he has kindly been referred today for discussion of potential radiation treatment options.  While it is possible that he has T3 disease based on findings on the PSMA PET scan, it is also  possible that the increased uptake in the left prostate and right SV could be filling of the TUR defect with tracer filled urine with reflux into the right SV, in light of his recent TURP procedure prior to this scan.   PREVIOUS RADIATION THERAPY: No  PAST MEDICAL HISTORY:  Past Medical History:  Diagnosis Date   Arthritis    BPH (benign prostatic hyperplasia)    Elevated PSA    GERD (gastroesophageal reflux disease)    History of kidney  stones    Hypertension    Meningioma (HCC)    frontal temporal, surgery scheduled 09/06/2021   Skin cancer       PAST SURGICAL HISTORY: Past Surgical History:  Procedure Laterality Date   CRANIOTOMY Left 09/06/2021   Procedure: Left Frontal temporal craniotomy for resection of meningioma;  Surgeon: Kristeen Miss, MD;  Location: [facility];  Service: Neurosurgery;  Laterality: Left;   CYST REMOVAL HAND     CYSTOSCOPY/URETEROSCOPY/HOLMIUM LASER/STENT PLACEMENT Left 08/26/2021   Procedure: CYSTOSCOPY; BASKET STONE EXTRACTION;  Surgeon: Irine Seal, MD;  Location: [facility];  Service: Urology;  Laterality: Left;   EYE SURGERY Right    cataract   PROSTATE BIOPSY N/A 07/12/2021   Procedure: BIOPSY TRANSRECTAL ULTRASONIC PROSTATE (TUBP);  Surgeon: Irine Seal, MD;  Location: [facility];  Service: Urology;  Laterality: N/A;  75 MINS FOR CASE   TONSILLECTOMY     TRANSURETHRAL RESECTION OF PROSTATE N/A 07/12/2021   Procedure: TRANSURETHRAL RESECTION OF THE PROSTATE (TURP);  Surgeon: Irine Seal, MD;  Location: [facility];  Service: Urology;  Laterality: N/A;  75 MINS FOR CASE   VASECTOMY     WISDOM TOOTH EXTRACTION      FAMILY HISTORY: No family history on file.  SOCIAL HISTORY:  Social History   Socioeconomic History   Marital status: Married    Spouse name: Not on file   Number of children: Not on file   Years of education: Not on file   Highest education level: Not on file  Occupational History   Not on file  Tobacco Use   Smoking status: Former    Types: Cigarettes   Smokeless tobacco: Never   Tobacco comments:    Quit 50 yrs ago  Vaping Use   Vaping Use: Never used  Substance and Sexual Activity   Alcohol use: Yes    Comment: Occasional   Drug use: Never   Sexual activity: Not on file    Comment: vasectomy  Other Topics Concern   Not on file  Social History Narrative   Not on file   Social Determinants of Health   Financial Resource Strain: Not on file  Food  Insecurity: Not on file  Transportation Needs: Not on file  Physical Activity: Not on file  Stress: Not on file  Social Connections: Not on file  Intimate Partner Violence: Not on file    ALLERGIES: Patient has no known allergies.  MEDICATIONS:  Current Outpatient Medications  Medication Sig Dispense Refill   acetaminophen (TYLENOL) 500 MG tablet Take 500-1,000 mg by mouth every 6 (six) hours as needed for headache (pain).     amLODipine (NORVASC) 10 MG tablet Take 10 mg by mouth daily.  1   aspirin EC 81 MG tablet Take 81 mg by mouth daily.     famotidine (PEPCID) 20 MG tablet Take 20 mg by mouth daily.     fluticasone (FLONASE) 50 MCG/ACT nasal spray Place 1 spray into both nostrils daily as needed for  allergies.     meloxicam (MOBIC) 15 MG tablet Take 15 mg by mouth daily.     omeprazole (PRILOSEC) 20 MG capsule Take 20 mg by mouth daily.     polyethylene glycol (MIRALAX / GLYCOLAX) 17 g packet Take 17 g by mouth daily.     Polyvinyl Alcohol-Povidone (REFRESH OP) Apply 1 drop to eye 2 (two) times daily as needed (dry eyes).     pravastatin (PRAVACHOL) 40 MG tablet Take 40 mg by mouth daily.     tamsulosin (FLOMAX) 0.4 MG CAPS capsule Take 0.8 mg by mouth daily.     traMADol (ULTRAM) 50 MG tablet Take 1 tablet (50 mg total) by mouth every 6 (six) hours as needed. 20 tablet 0   No current facility-administered medications for this encounter.   REVIEW OF SYSTEMS:  On review of systems, the patient reports that he is doing well overall. He denies any chest pain, shortness of breath, cough, fevers, chills, night sweats, unintended weight changes. He denies any bowel disturbances, and denies abdominal pain, nausea or vomiting. He denies any new musculoskeletal or joint aches or pains. His IPSS was Total Score: 11, indicating moderate urinary symptoms (Reference 0-7 mild, 8-19 moderate, 20-35 severe).  His SHIM: 6, indicating he has severe erectile dysfunction (Reference - 22-25 None, 17-21  Mild, 8-16 Moderate, 1-7 Severe). A complete review of systems is obtained and is otherwise negative.  PHYSICAL EXAM:  Wt Readings from Last 3 Encounters:  10/24/21 191 lb 6.4 oz (86.8 kg)  09/06/21 195 lb (88.5 kg)  08/31/21 190 lb 8 oz (86.4 kg)   Temp Readings from Last 3 Encounters:  10/24/21 98.7 F (37.1 C)  09/09/21 98.5 F (36.9 C) (Oral)  08/31/21 98.2 F (36.8 C)   BP Readings from Last 3 Encounters:  10/24/21 120/78  09/09/21 126/77  08/31/21 132/77   Pulse Readings from Last 3 Encounters:  10/24/21 98  09/09/21 71  08/31/21 (!) 57   Pain Assessment Pain Score: 0-No pain/10  In general this is a well appearing Caucasian male in no acute distress. He's alert and oriented x4 and appropriate throughout the examination. Cardiopulmonary assessment is negative for acute distress, and he exhibits normal effort.     KPS = 100  100 - Normal; no complaints; no evidence of disease. 90   - Able to carry on normal activity; minor signs or symptoms of disease. 80   - Normal activity with effort; some signs or symptoms of disease. 6   - Cares for self; unable to carry on normal activity or to do active work. 60   - Requires occasional assistance, but is able to care for most of his personal needs. 50   - Requires considerable assistance and frequent medical care. 47   - Disabled; requires special care and assistance. 70   - Severely disabled; hospital admission is indicated although death not imminent. 68   - Very sick; hospital admission necessary; active supportive treatment necessary. 10   - Moribund; fatal processes progressing rapidly. 0     - Dead  Karnofsky DA, Abelmann [place], Craver LS and Burchenal [facility] [phone]) The use of the nitrogen mustards in the palliative treatment of carcinoma: with particular reference to bronchogenic carcinoma Cancer 1 634-56  LABORATORY DATA:  Lab Results  Component Value Date   WBC 33.6 (H) 09/07/2021   HGB 13.4 09/07/2021   HCT 38.5  (L) 09/07/2021   MCV 80.7 09/07/2021   PLT 239 09/07/2021   Lab  Results  Component Value Date   NA 132 (L) 08/31/2021   K 4.0 08/31/2021   CL 100 08/31/2021   CO2 22 08/31/2021   No results found for: "ALT", "AST", "GGT", "ALKPHOS", "BILITOT"   RADIOGRAPHY: CT HEAD WO CONTRAST (5MM)  Result Date: 10/05/2021 CLINICAL DATA:  Cerebral meningioma. EXAM: CT HEAD WITHOUT CONTRAST TECHNIQUE: Contiguous axial images were obtained from the base of the skull through the vertex without intravenous contrast. RADIATION DOSE REDUCTION: This exam was performed according to the departmental dose-optimization program which includes automated exposure control, adjustment of the mA and/or kV according to patient size and/or use of iterative reconstruction technique. COMPARISON:  CT head September 07, 2021. FINDINGS: Brain: Decreased volume and conspicuity of previously seen intraparenchymal and extra-axial hemorrhage, compatible with expected evolution. Small extra-axial fluid collection/hemorrhage overlying the resection site. Pneumocephalus has resolved mildly increased hypodensity in the region of tumor resection. Mass effect is improved with approximately 5 mm of rightward midline shift. Mild increase in size of the ventricles, probably related to decreased mass effect. No definite hydrocephalus. No evidence of acute large vascular territory infarct. Vascular: No hyperdense vessel identified. Skull: Left frontal craniotomy.  No acute fracture. Sinuses/Orbits: Clear sinuses.  No acute orbital findings. Other: No mastoid effusions. IMPRESSION: 1. Decreased volume and conspicuity of previously seen intraparenchymal and extra-axial hemorrhage, compatible with expected evolution. Mildly increased hypodensity in the region of tumor resection. This probably represents developing encephalomalacia, but progressive tumor is not excluded by noncontrast head CT. Consider MRI with contrast to better assess and to establish a  postoperative baseline. 2. Associated mass effect is improved with approximately 5 mm of rightward midline shift (previously 11 mm). Electronically Signed   By: Margaretha Sheffield M.D.   On: 10/05/2021 11:08      IMPRESSION/PLAN: 1. 81 y.o. gentleman with Stage T2a/T3 adenocarcinoma of the prostate with Gleason Score of 4+3, and PSA of 5.86.  We discussed the patient's workup and outlined the nature of prostate cancer in this setting. The patient's T stage, Gleason's score, and PSA put him into the unfavorable intermediate risk group. Accordingly, he is eligible for a variety of potential treatment options including prostatectomy, or ST-ADT in combination with 5.5-8 weeks of external radiation.  We discussed the available radiation techniques, and focused on the details and logistics of delivery. He is not a candidate for brachytherapy given his recent TURP procedure and is not felt to be an ideal surgical candidate given his advanced age.  Therefore, we discussed and outlined the risks, benefits, short and long-term effects associated with daily external beam radiotherapy and compared and contrasted these with prostatectomy. We discussed the role of SpaceOAR gel in reducing the rectal toxicity associated with radiotherapy. We also detailed the role of ADT in the treatment of unfavorable intermediate risk prostate cancer and outlined the associated side effects that could be expected with this therapy.  He appears to have a good understanding of his disease and our treatment recommendations which are of curative intent.  He was encouraged to ask questions that were answered to his stated satisfaction.  At the conclusion of our conversation, the patient is interested in moving forward with considering his treatment options.  He is still slowly recovering from brain surgery and struggling with some urinary symptoms.  We could continue surveillance, consider ADT alone, radiation alone or combined ADT and  radiation.  He plans to follow up with his PCP and Dr. Jeffie Pollock.  I personally spent 60 minutes in this encounter  including chart review, reviewing radiological studies, meeting face-to-face with the patient, entering orders and completing documentation.    Nicholos Johns, PA-C    Tyler Pita, MD  [facility] Direct Dial: [phone]  Fax: [phone] [email or web address]  Skype  LinkedIn

## 2021-10-24 ENCOUNTER — Ambulatory Visit
Admission: RE | Admit: 2021-10-24 | Discharge: 2021-10-24 | Disposition: A | Discharge status: Home / Self Care | Payer: PPO | Source: Ambulatory Visit | Attending: Radiation Oncology | Admitting: Radiation Oncology

## 2021-10-24 ENCOUNTER — Other Ambulatory Visit: Payer: Self-pay

## 2021-10-24 VITALS — BP 120/78 | HR 98 | Temp 98.7°F | Resp 20 | Ht 68.0 in | Wt 191.4 lb

## 2021-10-24 DIAGNOSIS — Z791 Long term (current) use of non-steroidal anti-inflammatories (NSAID): Secondary | ICD-10-CM | POA: Insufficient documentation

## 2021-10-24 DIAGNOSIS — D320 Benign neoplasm of cerebral meninges: Secondary | ICD-10-CM | POA: Insufficient documentation

## 2021-10-24 DIAGNOSIS — K219 Gastro-esophageal reflux disease without esophagitis: Secondary | ICD-10-CM | POA: Insufficient documentation

## 2021-10-24 DIAGNOSIS — C61 Malignant neoplasm of prostate: Secondary | ICD-10-CM | POA: Insufficient documentation

## 2021-10-24 DIAGNOSIS — Z79899 Other long term (current) drug therapy: Secondary | ICD-10-CM | POA: Insufficient documentation

## 2021-10-24 DIAGNOSIS — Z87891 Personal history of nicotine dependence: Secondary | ICD-10-CM | POA: Diagnosis not present

## 2021-10-24 DIAGNOSIS — Z85828 Personal history of other malignant neoplasm of skin: Secondary | ICD-10-CM | POA: Diagnosis not present

## 2021-10-24 DIAGNOSIS — Z7982 Long term (current) use of aspirin: Secondary | ICD-10-CM | POA: Insufficient documentation

## 2021-10-24 DIAGNOSIS — I10 Essential (primary) hypertension: Secondary | ICD-10-CM | POA: Diagnosis not present

## 2021-10-24 DIAGNOSIS — Z87442 Personal history of urinary calculi: Secondary | ICD-10-CM | POA: Diagnosis not present

## 2021-10-24 DIAGNOSIS — Z191 Hormone sensitive malignancy status: Secondary | ICD-10-CM | POA: Diagnosis not present

## 2021-10-25 NOTE — Progress Notes (Signed)
Introduced myself to the patient, and his wife, as the prostate nurse navigator.  He is here to discuss his radiation treatment options, has agreed with Dr. Tammi Klippel to take additional time to consider his options for treatment as he is still recovering from brain surgery.  I gave him my business card and asked him to call me with questions or concerns.  Verbalized understanding.

## 2021-10-27 DIAGNOSIS — R2243 Localized swelling, mass and lump, lower limb, bilateral: Secondary | ICD-10-CM | POA: Diagnosis not present

## 2021-10-27 DIAGNOSIS — C61 Malignant neoplasm of prostate: Secondary | ICD-10-CM | POA: Diagnosis not present

## 2021-10-27 DIAGNOSIS — Z9889 Other specified postprocedural states: Secondary | ICD-10-CM | POA: Diagnosis not present

## 2021-10-27 DIAGNOSIS — R600 Localized edema: Secondary | ICD-10-CM | POA: Diagnosis not present

## 2021-11-02 ENCOUNTER — Telehealth: Payer: Self-pay

## 2021-11-02 NOTE — Telephone Encounter (Signed)
RN returned call after identity confirmed spoke with him and his wife about concerns for when he will be starting radiation treatment.  After discussion both voiced understanding and thanks RN for calling them back.  No else follows.

## 2021-11-03 NOTE — Progress Notes (Signed)
RN spoke with patient, and patient's wife to review recommendations from providers.   Pt and wife aware that Dr. Jeffie Pollock will recheck testosterone on 8/15 and based on those results treatment plan will be finalized to include either ADT plus radiation or radiation alone.  RN answered questions and provided education, no further needs at this time.   Will follow up after labs are final ensure treatment coordination.

## 2021-11-08 DIAGNOSIS — C61 Malignant neoplasm of prostate: Secondary | ICD-10-CM | POA: Diagnosis not present

## 2021-11-14 DIAGNOSIS — C61 Malignant neoplasm of prostate: Secondary | ICD-10-CM | POA: Diagnosis not present

## 2021-11-14 DIAGNOSIS — R351 Nocturia: Secondary | ICD-10-CM | POA: Diagnosis not present

## 2021-11-14 DIAGNOSIS — N3281 Overactive bladder: Secondary | ICD-10-CM | POA: Diagnosis not present

## 2021-11-15 ENCOUNTER — Telehealth: Payer: Self-pay | Admitting: *Deleted

## 2021-11-15 ENCOUNTER — Other Ambulatory Visit: Payer: Self-pay | Admitting: Urology

## 2021-11-15 NOTE — Telephone Encounter (Signed)
CALLED PATIENT TO INFORM OF FID. MARKER AND SPACE OAR PLACEMENT ON 12-27-21 AND HIS SIM ON 01-05-22- ARRIVAL TIME - 2:45 PM @ [facility], INFORMED PATIENT TO ARRIVE WITH A FULL BLADDER, SPOKE WITH PATIENT'S WIFE-DOROTHY AND SHE IS AWARE OF THESE APPTS. AND THE INSTRUCTIONS

## 2021-11-15 NOTE — Progress Notes (Signed)
Patient started [place] under the care of Dr. Jeffie Pollock on 8/21.   RN spoke with patient's wife to update on treatment plan dates.  RN educated on daily radiation starting approximately 2 months after ADT.   RN explained next steps including getting patient set up for fiducial markers, and CT Simulation prior to daily radiation starting.   Verbalized understanding.  Encouraged patient's wife and patient to call with any questions or concerns that may arise.

## 2021-11-23 DIAGNOSIS — E782 Mixed hyperlipidemia: Secondary | ICD-10-CM | POA: Diagnosis not present

## 2021-11-23 DIAGNOSIS — I10 Essential (primary) hypertension: Secondary | ICD-10-CM | POA: Diagnosis not present

## 2021-11-23 DIAGNOSIS — Z23 Encounter for immunization: Secondary | ICD-10-CM | POA: Diagnosis not present

## 2021-11-23 DIAGNOSIS — Z86018 Personal history of other benign neoplasm: Secondary | ICD-10-CM | POA: Diagnosis not present

## 2021-11-23 DIAGNOSIS — R4701 Aphasia: Secondary | ICD-10-CM | POA: Diagnosis not present

## 2021-11-23 DIAGNOSIS — M1990 Unspecified osteoarthritis, unspecified site: Secondary | ICD-10-CM | POA: Diagnosis not present

## 2021-11-23 DIAGNOSIS — Z0000 Encounter for general adult medical examination without abnormal findings: Secondary | ICD-10-CM | POA: Diagnosis not present

## 2021-11-23 DIAGNOSIS — R600 Localized edema: Secondary | ICD-10-CM | POA: Diagnosis not present

## 2021-11-23 DIAGNOSIS — K5790 Diverticulosis of intestine, part unspecified, without perforation or abscess without bleeding: Secondary | ICD-10-CM | POA: Diagnosis not present

## 2021-11-23 DIAGNOSIS — C61 Malignant neoplasm of prostate: Secondary | ICD-10-CM | POA: Diagnosis not present

## 2021-12-06 ENCOUNTER — Encounter (HOSPITAL_BASED_OUTPATIENT_CLINIC_OR_DEPARTMENT_OTHER): Payer: Self-pay | Admitting: Urology

## 2021-12-06 NOTE — Progress Notes (Signed)
Spoke w/ via phone for pre-op interview--- Jared Shelton and wife Jared Shelton Lab needs dos---- [employer] results------ current EKG dated 06/2021 in [place]. COVID test -----patient states asymptomatic no test needed Arrive at -------0700 NPO after MN NO Solid Food.   Med rec completed Medications to take morning of surgery ----- Norvasc Diabetic medication ----- Patient instructed no nail polish to be worn day of surgery Patient instructed to bring photo id and insurance card day of surgery Patient aware to have Driver (ride ) / caregiver Wife Jared Shelton   for 24 hours after surgery  Patient Special Instructions ----- Pre-Op special Istructions ----- Patient verbalized understanding of instructions that were given at this phone interview. Patient denies shortness of breath, chest pain, fever, cough at this phone interview.

## 2021-12-08 DIAGNOSIS — C61 Malignant neoplasm of prostate: Secondary | ICD-10-CM | POA: Diagnosis not present

## 2021-12-14 DIAGNOSIS — C61 Malignant neoplasm of prostate: Secondary | ICD-10-CM | POA: Diagnosis not present

## 2021-12-14 DIAGNOSIS — Z191 Hormone sensitive malignancy status: Secondary | ICD-10-CM | POA: Diagnosis not present

## 2021-12-15 DIAGNOSIS — C61 Malignant neoplasm of prostate: Secondary | ICD-10-CM | POA: Diagnosis not present

## 2021-12-21 DIAGNOSIS — Z23 Encounter for immunization: Secondary | ICD-10-CM | POA: Diagnosis not present

## 2021-12-22 NOTE — Progress Notes (Signed)
RN spoke with patient and patient's wife and reviewed upcoming steps for CT Simulation after fiducial's and spaceOAR.  Both verbalized understanding and agreement.   Pt did have c/o lower back pain.  Denies any injury to area.  Patient is now more active since his brain surgery, and has been released to drive, and is doing activities outside.  I encouraged patient to use Ibuprofen/Tylenol with a heating pad.  Patient will try recommendations and update on if this provided any relief.

## 2021-12-27 ENCOUNTER — Ambulatory Visit (HOSPITAL_BASED_OUTPATIENT_CLINIC_OR_DEPARTMENT_OTHER): Payer: PPO | Admitting: Anesthesiology

## 2021-12-27 ENCOUNTER — Encounter (HOSPITAL_BASED_OUTPATIENT_CLINIC_OR_DEPARTMENT_OTHER): Payer: Self-pay | Admitting: Urology

## 2021-12-27 ENCOUNTER — Other Ambulatory Visit: Payer: Self-pay

## 2021-12-27 ENCOUNTER — Ambulatory Visit (HOSPITAL_BASED_OUTPATIENT_CLINIC_OR_DEPARTMENT_OTHER)
Admission: RE | Admit: 2021-12-27 | Discharge: 2021-12-27 | Disposition: A | Discharge status: Home / Self Care | Payer: PPO | Source: Ambulatory Visit | Attending: Urology | Admitting: Urology

## 2021-12-27 ENCOUNTER — Encounter (HOSPITAL_BASED_OUTPATIENT_CLINIC_OR_DEPARTMENT_OTHER)
Admission: RE | Disposition: A | Discharge status: Home / Self Care | Payer: Self-pay | Source: Ambulatory Visit | Attending: Urology

## 2021-12-27 DIAGNOSIS — I10 Essential (primary) hypertension: Secondary | ICD-10-CM | POA: Insufficient documentation

## 2021-12-27 DIAGNOSIS — C61 Malignant neoplasm of prostate: Secondary | ICD-10-CM | POA: Insufficient documentation

## 2021-12-27 DIAGNOSIS — K219 Gastro-esophageal reflux disease without esophagitis: Secondary | ICD-10-CM | POA: Diagnosis not present

## 2021-12-27 DIAGNOSIS — M1990 Unspecified osteoarthritis, unspecified site: Secondary | ICD-10-CM | POA: Diagnosis not present

## 2021-12-27 DIAGNOSIS — Z87891 Personal history of nicotine dependence: Secondary | ICD-10-CM | POA: Insufficient documentation

## 2021-12-27 DIAGNOSIS — N289 Disorder of kidney and ureter, unspecified: Secondary | ICD-10-CM

## 2021-12-27 HISTORY — PX: GOLD SEED IMPLANT: SHX6343

## 2021-12-27 HISTORY — PX: SPACE OAR INSTILLATION: SHX6769

## 2021-12-27 LAB — POCT I-STAT, CHEM 8
BUN: 20 mg/dL (ref 8–23)
Calcium, Ion: 1.17 mmol/L (ref 1.15–1.40)
Chloride: 105 mmol/L (ref 98–111)
Creatinine, Ser: 1.2 mg/dL (ref 0.61–1.24)
Glucose, Bld: 118 mg/dL — ABNORMAL HIGH (ref 70–99)
HCT: 43 % (ref 39.0–52.0)
Hemoglobin: 14.6 g/dL (ref 13.0–17.0)
Potassium: 3.4 mmol/L — ABNORMAL LOW (ref 3.5–5.1)
Sodium: 141 mmol/L (ref 135–145)
TCO2: 22 mmol/L (ref 22–32)

## 2021-12-27 SURGERY — INSERTION, GOLD SEEDS
Anesthesia: Monitor Anesthesia Care | Site: Prostate

## 2021-12-27 MED ORDER — OXYCODONE HCL 5 MG PO TABS: 5.0000 mg | ORAL_TABLET | Freq: Once | ORAL | Status: DC | PRN

## 2021-12-27 MED ORDER — CEFAZOLIN SODIUM-DEXTROSE 2-4 GM/100ML-% IV SOLN
INTRAVENOUS | Status: AC
  Filled 2021-12-27: qty 100

## 2021-12-27 MED ORDER — FENTANYL CITRATE (PF) 100 MCG/2ML IJ SOLN
INTRAMUSCULAR | Status: DC | PRN
  Administered 2021-12-27: 25 ug via INTRAVENOUS
  Administered 2021-12-27 (×2): 50 ug via INTRAVENOUS

## 2021-12-27 MED ORDER — PROMETHAZINE HCL 25 MG/ML IJ SOLN: 6.2500 mg | INTRAMUSCULAR | Status: DC | PRN

## 2021-12-27 MED ORDER — SODIUM CHLORIDE (PF) 0.9 % IJ SOLN
INTRAMUSCULAR | Status: DC | PRN
  Administered 2021-12-27: 10 mL

## 2021-12-27 MED ORDER — FENTANYL CITRATE (PF) 100 MCG/2ML IJ SOLN
INTRAMUSCULAR | Status: AC
  Filled 2021-12-27: qty 2

## 2021-12-27 MED ORDER — PROPOFOL 10 MG/ML IV BOLUS
INTRAVENOUS | Status: DC | PRN
  Administered 2021-12-27: 20 mg via INTRAVENOUS
  Administered 2021-12-27: 10 mg via INTRAVENOUS
  Administered 2021-12-27: 20 mg via INTRAVENOUS

## 2021-12-27 MED ORDER — ONDANSETRON HCL 4 MG/2ML IJ SOLN
INTRAMUSCULAR | Status: DC | PRN
  Administered 2021-12-27: 4 mg via INTRAVENOUS

## 2021-12-27 MED ORDER — PROPOFOL 500 MG/50ML IV EMUL
INTRAVENOUS | Status: DC | PRN
  Administered 2021-12-27: 200 ug/kg/min via INTRAVENOUS

## 2021-12-27 MED ORDER — MIDAZOLAM HCL 2 MG/2ML IJ SOLN: 0.5000 mg | Freq: Once | INTRAMUSCULAR | Status: DC | PRN

## 2021-12-27 MED ORDER — FLEET ENEMA 7-19 GM/118ML RE ENEM: 1.0000 | ENEMA | Freq: Once | RECTAL | Status: DC

## 2021-12-27 MED ORDER — OXYCODONE HCL 5 MG/5ML PO SOLN: 5.0000 mg | Freq: Once | ORAL | Status: DC | PRN

## 2021-12-27 MED ORDER — LACTATED RINGERS IV SOLN
INTRAVENOUS | Status: DC
  Administered 2021-12-27: 1000 mL via INTRAVENOUS

## 2021-12-27 MED ORDER — FENTANYL CITRATE (PF) 100 MCG/2ML IJ SOLN: 25.0000 ug | INTRAMUSCULAR | Status: DC | PRN

## 2021-12-27 MED ORDER — ACETAMINOPHEN 500 MG PO TABS: 1000.0000 mg | ORAL_TABLET | Freq: Once | ORAL | Status: DC

## 2021-12-27 MED ORDER — BUPIVACAINE-EPINEPHRINE (PF) 0.25% -1:200000 IJ SOLN
INTRAMUSCULAR | Status: DC | PRN
  Administered 2021-12-27: 10 mL

## 2021-12-27 MED ORDER — CEFAZOLIN SODIUM-DEXTROSE 2-4 GM/100ML-% IV SOLN
2.0000 g | INTRAVENOUS | Status: AC
  Administered 2021-12-27: 2 g via INTRAVENOUS

## 2021-12-27 SURGICAL SUPPLY — 26 items
BLADE CLIPPER SENSICLIP SURGIC (BLADE) ×2 IMPLANT
CNTNR URN SCR LID CUP LEK RST (MISCELLANEOUS) ×2 IMPLANT
CONT SPEC 4OZ STRL OR WHT (MISCELLANEOUS) ×2
COVER BACK TABLE 60X90IN (DRAPES) ×2 IMPLANT
DRSG TEGADERM 4X4.75 (GAUZE/BANDAGES/DRESSINGS) ×2 IMPLANT
DRSG TEGADERM 8X12 (GAUZE/BANDAGES/DRESSINGS) ×2 IMPLANT
GAUZE SPONGE 4X4 12PLY STRL (GAUZE/BANDAGES/DRESSINGS) ×2 IMPLANT
GAUZE SPONGE 4X4 12PLY STRL LF (GAUZE/BANDAGES/DRESSINGS) IMPLANT
GLOVE BIO SURGEON STRL SZ7.5 (GLOVE) ×2 IMPLANT
GLOVE ECLIPSE 8.0 STRL XLNG CF (GLOVE) ×2 IMPLANT
GLOVE SURG ORTHO 8.5 STRL (GLOVE) ×2 IMPLANT
IMPL SPACEOAR VUE SYSTEM (Spacer) ×2 IMPLANT
IMPLANT SPACEOAR VUE SYSTEM (Spacer) ×2 IMPLANT
KIT TURNOVER CYSTO (KITS) ×2 IMPLANT
MARKER GOLD PRELOAD 1.2X3 (Urological Implant) ×2 IMPLANT
MARKER SKIN DUAL TIP RULER LAB (MISCELLANEOUS) ×2 IMPLANT
NDL SPNL 22GX3.5 QUINCKE BK (NEEDLE) ×2 IMPLANT
NEEDLE SPNL 22GX3.5 QUINCKE BK (NEEDLE) ×2 IMPLANT
SEED GOLD PRELOAD 1.2X3 (Urological Implant) ×2 IMPLANT
SHEATH ULTRASOUND LF (SHEATH) IMPLANT
SHEATH ULTRASOUND LTX NONSTRL (SHEATH) IMPLANT
SURGILUBE 2OZ TUBE FLIPTOP (MISCELLANEOUS) ×2 IMPLANT
SYR 10ML LL (SYRINGE) ×2 IMPLANT
SYR CONTROL 10ML LL (SYRINGE) ×2 IMPLANT
TOWEL OR 17X26 10 PK STRL BLUE (TOWEL DISPOSABLE) ×2 IMPLANT
UNDERPAD 30X36 HEAVY ABSORB (UNDERPADS AND DIAPERS) ×2 IMPLANT

## 2021-12-27 NOTE — Anesthesia Preprocedure Evaluation (Addendum)
Anesthesia Evaluation  Patient identified by MRN, date of birth, ID band Patient awake    Reviewed: Allergy & Precautions, NPO status , Patient's Chart, lab work & pertinent test results  History of Anesthesia Complications Negative for: history of anesthetic complications  Airway Mallampati: II  TM Distance: >3 FB Neck ROM: Full    Dental  (+) Caps, Dental Advisory Given   Pulmonary former smoker,    breath sounds clear to auscultation       Cardiovascular hypertension, Pt. on medications (-) angina Rhythm:Regular Rate:Normal     Neuro/Psych Meningioma 08/2021    GI/Hepatic Neg liver ROS, GERD  Medicated and Controlled,  Endo/Other  negative endocrine ROS  Renal/GU Renal InsufficiencyRenal disease   Prostate cancer    Musculoskeletal  (+) Arthritis ,   Abdominal   Peds  Hematology negative hematology ROS (+)   Anesthesia Other Findings   Reproductive/Obstetrics                            Anesthesia Physical Anesthesia Plan  ASA: 3  Anesthesia Plan: MAC   Post-op Pain Management: Tylenol PO (pre-op)*   Induction:   PONV Risk Score and Plan: 1 and Ondansetron  Airway Management Planned: Natural Airway and Simple Face Mask  Additional Equipment: None  Intra-op Plan:   Post-operative Plan:   Informed Consent: I have reviewed the patients History and Physical, chart, labs and discussed the procedure including the risks, benefits and alternatives for the proposed anesthesia with the patient or authorized representative who has indicated his/her understanding and acceptance.     Dental advisory given  Plan Discussed with: CRNA and Surgeon  Anesthesia Plan Comments:        Anesthesia Quick Evaluation

## 2021-12-27 NOTE — Op Note (Signed)
Operative Note  Preoperative diagnosis:  1.  Adenocarcinoma of the prostate   Postoperative diagnosis: 1.  Adenocarcinoma of the prostate  Procedure(s): 1. Placement of fiducial markers into prostate 2. Insertion of SpaceOAR hydrogel   Surgeon: Rexene Alberts, MD  Assistants:  None  Anesthesia:  General  Complications:  None  EBL:  Minimal  Specimens: 1. None  Drains/Catheters: 1.  None  Indication:  Jared Shelton. is a 81 y.o. male with clinically localized prostate cancer. After discussing management options for treatment, he elected to proceed with radiotherapy. He presents today for the above procedures. The potential risks, complications, alternative options, and expected recovery course have been discussed in detail with the patient and he has provided informed consent to proceed.  Description of procedure: The patient was administered preoperative antibiotics, placed in the dorsal lithotomy position, and prepped and draped in the usual sterile fashion. Next, transrectal ultrasonography was utilized to visualize the prostate. Three gold fiducial markers were then placed into the prostate via transperineal needles under ultrasound guidance at the right apex, right base, and left mid gland under direct ultrasound guidance. A site in the midline was then selected on the perineum for placement of an 18 g needle with saline. The needle was advanced above the rectum and below Denonvillier's fascia to the mid gland and confirmed to be in the midline on transverse imaging. One cc of saline was injected confirming appropriate expansion of this space. A total of 5 cc of saline was then injected to open the space further bilaterally. The saline syringe was then removed and the SpaceOAR hydrogel was injected with good distribution bilaterally. He tolerated the procedure well and without complications. He was given a voiding trial prior to discharge from the PACU.  Matt R. Dillon  Urology  Pager: [phone]

## 2021-12-27 NOTE — Discharge Instructions (Addendum)
Activity:  You are encouraged to ambulate frequently (about every hour during waking hours) to help prevent blood clots from forming in your legs or lungs.    Diet: You should advance your diet as instructed by your physician.  It will be normal to have some bloating, nausea, and abdominal discomfort intermittently.  What to call us about: You should call the office [phone]) if you develop fever > 101 or develop persistent vomiting. Activity:  You are encouraged to ambulate frequently (about every hour during waking hours) to help prevent blood clots from forming in your legs or lungs.      Post Anesthesia Home Care Instructions  Activity: Get plenty of rest for the remainder of the day. A responsible individual must stay with you for 24 hours following the procedure.  For the next 24 hours, DO NOT: -Drive a car -Paediatric nurse -Drink alcoholic beverages -Take any medication unless instructed by your physician -Make any legal decisions or sign important papers.  Meals: Start with liquid foods such as gelatin or soup. Progress to regular foods as tolerated. Avoid greasy, spicy, heavy foods. If nausea and/or vomiting occur, drink only clear liquids until the nausea and/or vomiting subsides. Call your physician if vomiting continues.  Special Instructions/Symptoms: Your throat may feel dry or sore from the anesthesia or the breathing tube placed in your throat during surgery. If this causes discomfort, gargle with warm salt water. The discomfort should disappear within 24 hours.

## 2021-12-27 NOTE — Anesthesia Postprocedure Evaluation (Signed)
Anesthesia Post Note  Patient: Jared Shelton.  Procedure(s) Performed: GOLD SEED IMPLANT (Prostate) SPACE OAR INSTILLATION (Perineum)     Patient location during evaluation: Phase II Anesthesia Type: MAC Level of consciousness: awake and alert, patient cooperative and oriented Pain management: pain level controlled Vital Signs Assessment: post-procedure vital signs reviewed and stable Respiratory status: spontaneous breathing, nonlabored ventilation and respiratory function stable Cardiovascular status: stable and blood pressure returned to baseline Postop Assessment: no apparent nausea or vomiting, able to ambulate and adequate PO intake Anesthetic complications: no   No notable events documented.  Last Vitals:  Vitals:   12/27/21 1001 12/27/21 1010  BP: 122/65 133/80  Pulse: (!) 52 (!) 57  Resp: 15 19  Temp:    SpO2: 100% 98%    Last Pain:  Vitals:   12/27/21 1030  TempSrc:   PainSc: 0-No pain                 Atiana Levier,E. Ramesh Moan

## 2021-12-27 NOTE — H&P (Signed)
Office Visit Report     12/15/2021   --------------------------------------------------------------------------------   Jared Shelton  MRN: [id number]  DOB: 1940-10-20, 81 year old Male  SSN:    PRIMARY CARE:  Kimberlee D. Brigitte Pulse, MD  REFERRING:  Irine Seal, MD  PROVIDER:  Irine Seal, M.D.  TREATING:  Daine Gravel, NP  LOCATION:  [facility] [phone]     --------------------------------------------------------------------------------   CC: I have prostate cancer.  HPI: Jared Shelton is a 81 year-old male established patient who is here evaluation for treatment of prostate cancer.  9/21/23[name] returns today for his Eligard injection as well as follow-up regarding his lab work. He is currently doing well and denies any bothersome complaints. He has not yet started vitamin D and calcium supplementation. Denies any voiding complaints.   8/21/23[name] returns today in f/u for further discussion of ADT. He has persistent urgency and frequency with nocturia x 3 but is slowly improving following the TURP and Ureteroscopy. He has more time to get to the bathroom. He has GG3 with intraductal ca. 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   7/28/23[name] returns today in f/u to discuss ADT as part of his prostate cancer therapy. He has GG3 with intraductal ca seen on the TURP path. He had a craniotomy for the menigioma found on the staging PET but he has had some recent onset tremors and weakness. He had a recent CT of the head and that looked good. He remains somewhat confused. He has an appointent to see Dr. Tammi Klippel on 7/31. His IPSS is 17 and his SHIM is 4.   08/10/2021: Jared Shelton returns today to discuss his recent biopsy and TURP. He has a better stream but he has frequency q2hrs and nocturia 3-5x nightly. His IPSS is still elevated at 22. His SHIM is 15. He saw a little blood recently. He has 3 active problems.   1. He had a right  mid prostate nodule and a PSA of 5.86 as an indication for the biopsy. The TURP path was negative but he had 2 cores on the right on pathology with GG3 disease with intraductal disease adjacent. I had him staged with a PET scan and he had increased uptake in the left prostate and right SV which on my review could be filling of the TUR defect with tracer filled urine with reflux into the right SV. There was also slight uptake in a left ext iliac node that was read as equvocal.   Stage t2a N0(/1) M0 GG3 disease with possible intraductal disease. T3 is possible on the PET but I am sceptical of the tracer in the prostate and SV after a TURP.    2. 40m LUVJ stone without obstruction that was actually [facility] I just noted when reviewing the PET films and looking for prior imaging. This stone could be contributing to his residual irritative voiding symptoms and will need to be removed.   3. 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 He has no focal neurologic symptoms but has some memory issues. He needs a neurosurgical consultation.     AUA Symptom Score: 50% of the time he has the sensation of not emptying his bladder completely when finished urinating. 50% of the time he has to urinate again fewer than two hours after he has finished  urinating. Less than 50% of the time he has to start and stop again several times when he urinates. Less than 50% of the time he finds it difficult to postpone urination. Less than 50% of the time he has a weak urinary stream. Less than 50% of the time he has to push or strain to begin urination. He has to get up to urinate 3 times from the time he goes to bed until the time he gets up in the morning.   Calculated AUA Symptom Score: 17    ALLERGIES: Cat Dander No Known Drug Allergies Red Dye     MEDICATIONS: Aspirin 81 mg tablet,chewable  Furosemide 20  mg tablet  Pravastatin Sodium 40 mg tablet     GU PSH: Complex Uroflow - 11/10/2020 Cysto Remove Stent FB Sim - 08/26/2021 Cystoscopy - 11/10/2020 Cystoscopy TURP - 07/12/2021       PSH Notes: 09/06/2021: Left frontal temporal craniotomy and gross total resection of meningioma   NON-GU PSH: No Non-GU PSH    GU PMH: Nocturia, He has persistent nocturia but some improvement in his urgency. - 11/14/2021, - 10/03/2021, - 09/02/2021, - 07/26/2021, - 06/27/2021, - 11/10/2020, Urinary symptoms are significantly impacting his sleep. He is on Flomax which is continue but also gave him 25 mg of Myrbetriq to try. If he likes medication I will send a prescription to the pharmacy. We do over to up to 50 mg. 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 We talked about interventions such as TURP and rezum. , - 2021 Overactive bladder - 11/14/2021, He continues to have daytime frequency q2hrs and nocturia x 3 since the TURP and ureteroscopy. I will monitor that. , - 10/21/2021, - 10/03/2021, His urinary symptoms have not improved with the TURP and stone removal. I don't want to start him on anything prior to the craniotomy which is on 6/13 but will have him return in 4-6 weeks to reassess. , - 09/02/2021 Prostate Cancer, His testosterone level has recovered and his PSA remains lower at 2.53. He has GG3 prostate cancer with intraductal ca on the TURP specimen so ADT with the radiation is indicated. I reviewed the risks and benefits of ADT as well as the available options including Orgovyx, Firmagon and Eligard. He will be given Jared Shelton '240mg'$  today and the return in a month with labs for Eligard. He was encourage to take Caltrate + D. I will let Dr. Tammi Klippel know. I discussed the risks, benefits and rational for Androgen Deprivation therapy. I reviewed the options including LHRH antagonists, LHRH agonists, Orchiectomy and second line therapies as indicated. I reviewed  the duration of therapy as required for either adjuvant, intermittent or continuous therapy. I reviewed the side effects of therapy including but not limited to fatigue, hot flashes, bone and muscle loss, metabolic abnormalities, cardiovascular toxicity, gynecomastia, loss of libido, erectile dysfunction, depression and cognitive decline. I reviewed the importance of diet and exercise along with bone supportive therapy while on ADT. - 11/14/2021, He has GG3 with associated intraductal prostate carcinoma. The PET suggested right SV involvement but on my review I felt that that could have been artifact from urine reflux from the TURP and the left sided prostate uptake is likely the urine filled TURP defect. He did have a possible left iliac node. He remains weak with some issues with aphasia, memory loss and tremuloousness since he craniotomy. He was doing well but has a bit of a regression since  coming off of dexamethasone. In his current condition, I would be reluctant to initiate ADT unless absolutely necessary. I did discuss the options and the side effect of Orgovyx, Firmagon and Lupron. He will see Dr. Tammi Klippel on Monday to discuss the radiation options but at this time, it might be more appropriate to monitor his situation until he has more fully recovered from the craniotomy. I will get a PSA and testosterone today and have him return in 3 months with a PSA. , - 10/21/2021, - 10/03/2021, We will need to address the prostate cancer when he has recovered from the craniotomy. , - 09/02/2021, He has a T2a N0-1 M0 GG3 prostate cancer with adjacent intraductal changes. I think the PET scan findings suggestive of bulky prostate disease and SV invasion maybe related to filling of the TUR defect and reflux into the SV and I will reach out to radiology about this, but the prostate cancer will likely need treatment with adjuvant ADT and radiation therapy but he needs management of the stone and meningioma first. , - 08/10/2021,  - 07/26/2021 Urinary Frequency - 10/03/2021, - 09/02/2021, - 07/26/2021, - 06/27/2021, - 11/10/2020 Urinary Urgency - 10/03/2021, - 09/02/2021, - 07/26/2021, - 06/27/2021, - 11/10/2020, - 2021 Ureteral calculus, The stone had passed into the bladder at surgery and was removed. It will be sent for analysis. - 09/02/2021, He has a 68m left UVJ stone that has been present since at least February and is not associated with obstruction but could be contributing to his irritative voiding symptoms. I am going to get him set up for ureteroscopy and it is probably impacted and ESWL is less likely to be effective. I have reviewed the risks of ureteroscopy including bleeding, infection, ureteral injury, need for a stent or secondary procedures, thrombotic events and anesthetic complications. , - 08/10/2021 Prostate nodule w/ LUTS - 07/26/2021, He has persistent mod/severe LUTS despite tamsulosin and finasteride. We discuss options for treatment including a TURP, Rezum and Urolift and I have recommended a TURP with a prostate [place] biopsy. I have reviewed the risks in detail. I reviewd the risks of a TURP including bleeding, infection, incontinence, stricture, need for secondary procedures, ejaculatory and erectile dysfunction, thrombotic events, fluid overload and anesthetic complications. I explained that 95% of men will have relief of the obstructive symptoms and about 70% will have relief of the irritative symptoms. , - 06/27/2021, He has progressive LUTS with a right prostate nodule and a history of an elevated PSA on finasteride. I discussed options for therapy including doubling tamulsosin or changing the alpha blocker, Urolift, Rezum and TURP. I am going to have him double the tamsulosin for now but think he will be best served with a TURP along with a prostate biopsy to r/o prostate cancer. I reviewd the risks of a TURP including bleeding, infection, incontinence, stricture, need for secondary procedures, ejaculatory and erectile  dysfunction, thrombotic events, fluid overload and anesthetic complications. I explained that 95% of men will have relief of the obstructive symptoms and about 70% will have relief of the irritative symptoms. , - 11/10/2020, A see above, - 2021 Elevated PSA, I will do a prostate biopsy at the time of the TURP and reviewed the risks of bleeding an infection from the biopsy. - 06/27/2021, I will repeat a PSA today and if it is further elevated I will push for a prostate [place] biopsy. If it is back down, we will see how he does with the doubled tamsulosin but  I may still want to consider a prostate [place] and biopsy. , - 11/10/2020, I discussed patient's PSA was actually in the age specific range for the patient and that we typically stop taking PSAs around age 26 because even if we found prostate cancer often times the risk benefit ratio in source not treating. However now that we know he has elevated PSA and I felt a nodule on DRE we discussed proceeding with prostate biopsy versus repeating PSA in bout 6 months. 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 I described how it works the best and about possible side effects. He will let me know how it goes. - 2021    NON-GU PMH: Benign neoplasm of cerebral meninges, I will make a referral to neurosurgery for the 5.5cm left frontal meningioma. - 08/10/2021 Arthritis Hypercholesterolemia Hypertension    FAMILY HISTORY: No Family History    SOCIAL HISTORY: Marital Status: Married Preferred Language: English; Ethnicity: Not Hispanic Or Latino; Race: White Current Smoking Status: Patient does not smoke anymore. Has not smoked since 04/28/1979.   Tobacco Use Assessment Completed: Used Tobacco in last 30 days? Social Drinker.  Drinks 1 caffeinated drink per day.    REVIEW OF SYSTEMS:    GU Review Male:   Patient  denies frequent urination, hard to postpone urination, burning/ pain with urination, get up at night to urinate, leakage of urine, stream starts and stops, trouble starting your stream, have to strain to urinate , erection problems, and penile pain.  Gastrointestinal (Upper):   Patient denies nausea, vomiting, and indigestion/ heartburn.  Gastrointestinal (Lower):   Patient denies diarrhea and constipation.  Constitutional:   Patient reports fatigue. Patient denies fever, night sweats, and weight loss.  Skin:   Patient denies skin rash/ lesion and itching.  Eyes:   Patient denies blurred vision and double vision.  Musculoskeletal:   Patient denies back pain and joint pain.  Neurological:   Patient denies headaches and dizziness.  Psychologic:   Patient denies depression and anxiety.   VITAL SIGNS: None   MULTI-SYSTEM PHYSICAL EXAMINATION:    Constitutional: Well-nourished. No physical deformities. Normally developed. Good grooming.  Cardiovascular: Normal temperature, normal extremity pulses, no swelling, no varicosities.  Skin: No paleness, no jaundice, no cyanosis. No lesion, no ulcer, no rash.  Neurologic / Psychiatric: Oriented to time, oriented to place, oriented to person. No depression, no anxiety, no agitation.  Gastrointestinal: No mass, no tenderness, no rigidity, non obese abdomen.     Complexity of Data:  Source Of History:  Patient  Lab Test Review:   PSA  Records Review:   Previous Doctor Records, Previous Patient Records  Urine Test Review:   Urinalysis   12/08/21 11/08/21 10/21/21 11/10/20  PSA  Total PSA 1.73 ng/mL 2.53 ng/mL 2.49 ng/mL 5.86 ng/mL  Free PSA    1.91 ng/mL  % Free PSA    33 % PSA    12/08/21  Hormones  Testosterone, Total 11.9 ng/dL    12/15/21  Urinalysis  Urine Appearance Clear   Urine Color Yellow   Urine Glucose Neg mg/dL  Urine Bilirubin Neg mg/dL  Urine Ketones Neg mg/dL  Urine Specific Gravity 1.025   Urine Blood Neg ery/uL  Urine pH  <=5.0   Urine Protein Neg mg/dL  Urine Urobilinogen 0.2 mg/dL  Urine Nitrites Neg   Urine Leukocyte Esterase Neg leu/uL  PROCEDURES:          Urinalysis Dipstick Dipstick Cont'd  Color: Yellow Bilirubin: Neg mg/dL  Appearance: Clear Ketones: Neg mg/dL  Specific Gravity: 1.025 Blood: Neg ery/uL  pH: <=5.0 Protein: Neg mg/dL  Glucose: Neg mg/dL Urobilinogen: 0.2 mg/dL    Nitrites: Neg    Leukocyte Esterase: Neg leu/uL         Eligard '45mg'$ / 6 Month - [id number], [id number] The hip was sterilely prepped with alcohol. Eligard was injected subcutaneously (Jared Shelton) using standard technique. The patient tolerated the procedure well. A band aid was applied. The site was dry when the patient left the exam room. The patient will return as scheduled.   Qty: 45 Adm. By: Jared Shelton  Unit: mg Lot No [id number]  Route: SQ Exp. Date 02/25/2023  Freq: Q6M Mfgr.:   Site: Right Buttock   ASSESSMENT:      ICD-10 Details  1 GU:   Prostate Cancer - C61 Chronic, Stable   PLAN:           Schedule Return Visit/Planned Activity: 3 Months - Office Visit, Follow up MD  Procedure: 12/15/2021 at [facility] - [phone] - Eligard '45mg'$ / 6 Month ([place]) - [id number], [phone]          Document Letter(s):  Created for Patient: Clinical Summary         Notes:   PSA levels are following as well as testosterone levels. He was counseled on the importance of beginning a vitamin D and calcium supplement. He will keep his upcoming surgery as scheduled as well as follow-up in 3 months for PSA and lab draws. He may return sooner for any concerns in the interval.        Next Appointment:      Next Appointment: 12/27/2021 10:30 AM    Appointment Type: Surgery     Location: [facility] [phone]    Provider: Rexene Alberts, M.D.    Reason for Visit: OP [place]    Urology Preoperative H&P   Chief Complaint: Prostate cancer  History of Present Illness: Jared Shelton.  is a 81 y.o. male with prostate cancer here for fiducial marker and SpaceOAR.    Past Medical History:  Diagnosis Date   Arthritis    BPH (benign prostatic hyperplasia)    Elevated PSA    GERD (gastroesophageal reflux disease)    History of kidney stones    Hypertension    Meningioma ([place])    frontal temporal, surgery scheduled 09/06/2021   Skin cancer     Past Surgical History:  Procedure Laterality Date   CRANIOTOMY Left 09/06/2021   Procedure: Left Frontal temporal craniotomy for resection of meningioma;  Surgeon: Kristeen Miss, MD;  Location: [facility];  Service: Neurosurgery;  Laterality: Left;   CYST REMOVAL HAND     CYSTOSCOPY/URETEROSCOPY/HOLMIUM LASER/STENT PLACEMENT Left 08/26/2021   Procedure: CYSTOSCOPY; BASKET STONE EXTRACTION;  Surgeon: Irine Seal, MD;  Location: [facility];  Service: Urology;  Laterality: Left;   EYE SURGERY Right    cataract   PROSTATE BIOPSY N/A 07/12/2021   Procedure: BIOPSY TRANSRECTAL ULTRASONIC PROSTATE (TUBP);  Surgeon: Irine Seal, MD;  Location: [facility];  Service: Urology;  Laterality: N/A;  75 MINS FOR CASE   TONSILLECTOMY     TRANSURETHRAL RESECTION OF PROSTATE N/A 07/12/2021   Procedure: TRANSURETHRAL RESECTION OF THE PROSTATE (TURP);  Surgeon: Irine Seal, MD;  Location: [facility];  Service: Urology;  Laterality:  N/A;  75 MINS FOR CASE   VASECTOMY     WISDOM TOOTH EXTRACTION      Allergies: No Known Allergies  History reviewed. No pertinent family history.  Social History:  reports that he has quit smoking. His smoking use included cigarettes. He has never used smokeless tobacco. He reports current alcohol use. He reports that he does not use drugs.  ROS: A complete review of systems was performed.  All systems are negative except for pertinent findings as noted.  Physical Exam:  Vital signs in last 24 hours: Temp:  [98 F (36.7 C)] 98 F (36.7 C) (10/03 0729) Pulse Rate:  [70] 70 (10/03 0729) Resp:  [17] 17 (10/03  0729) BP: (134)/(76) 134/76 (10/03 0729) SpO2:  [100 %] 100 % (10/03 0729) Weight:  [87.1 kg] 87.1 kg (10/03 0729) Constitutional:  Alert and oriented, No acute distress Cardiovascular: Regular rate and rhythm Respiratory: Normal respiratory effort, Lungs clear bilaterally GI: Abdomen is soft, nontender, nondistended, no abdominal masses GU: No CVA tenderness Lymphatic: No lymphadenopathy Neurologic: Grossly intact, no focal deficits Psychiatric: Normal mood and affect  Laboratory Data:  Recent Labs    12/27/21 0750  HGB 14.6  HCT 43.0    Recent Labs    12/27/21 0750  NA 141  K 3.4*  CL 105  GLUCOSE 118*  BUN 20  CREATININE 1.20     Results for orders placed or performed during the hospital encounter of 12/27/21 (from the past 24 hour(s))  I-STAT, chem 8     Status: Abnormal   Collection Time: 12/27/21  7:50 AM  Result Value Ref Range   Sodium 141 135 - 145 mmol/L   Potassium 3.4 (L) 3.5 - 5.1 mmol/L   Chloride 105 98 - 111 mmol/L   BUN 20 8 - 23 mg/dL   Creatinine, Ser 1.20 0.61 - 1.24 mg/dL   Glucose, Bld 118 (H) 70 - 99 mg/dL   Calcium, Ion 1.17 1.15 - 1.40 mmol/L   TCO2 22 22 - 32 mmol/L   Hemoglobin 14.6 13.0 - 17.0 g/dL   HCT 43.0 39.0 - 52.0 %   No results found for this or any previous visit (from the past 240 hour(s)).  Renal Function: Recent Labs    12/27/21 0750  CREATININE 1.20   Estimated Creatinine Clearance: 52.7 mL/min (by C-G formula based on SCr of 1.2 mg/dL).  Radiologic Imaging: No results found.  I independently reviewed the above imaging studies.  Assessment and Plan Jared Shelton. is a 81 y.o. male with with prostate cancer here for fiducial marker and SpaceOAR.     Matt R. Zunaira Lamy MD 12/27/2021, 8:49 AM  [facility] Pager: [phone]): [phone]

## 2021-12-27 NOTE — Transfer of Care (Signed)
Immediate Anesthesia Transfer of Care Note  Patient: Jared Shelton.  Procedure(s) Performed: GOLD SEED IMPLANT (Prostate) SPACE OAR INSTILLATION (Perineum)  Patient Location: PACU  Anesthesia Type:MAC  Level of Consciousness: awake, alert , oriented and patient cooperative  Airway & Oxygen Therapy: Patient Spontanous Breathing  Post-op Assessment: Report given to RN and Post -op Vital signs reviewed and stable  Post vital signs: Reviewed and stable  Last Vitals:  Vitals Value Taken Time  BP    Temp    Pulse 62 12/27/21 0937  Resp 14 12/27/21 0937  SpO2 92 % 12/27/21 0937  Vitals shown include unvalidated device data.  Last Pain:  Vitals:   12/27/21 0729  TempSrc: Oral  PainSc: 0-No pain      Patients Stated Pain Goal: 5 (82/70/78 6754)  Complications: No notable events documented.

## 2021-12-28 ENCOUNTER — Encounter (HOSPITAL_BASED_OUTPATIENT_CLINIC_OR_DEPARTMENT_OTHER): Payer: Self-pay | Admitting: Urology

## 2022-01-04 ENCOUNTER — Telehealth: Payer: Self-pay | Admitting: *Deleted

## 2022-01-04 NOTE — Telephone Encounter (Signed)
CALLED PATIENT TO REMIND OF SIM APPT. FOR 01-05-22- ARRIVAL TIME- 2:45 PM @ [facility], INFORMED PATIENT TO ARRIVE WITH A FULL BLADDER, SPOKE WITH PATIENT AND HE IS AWARE OF THIS APPT. AND THE INSTRUCTIONS

## 2022-01-05 ENCOUNTER — Other Ambulatory Visit: Payer: Self-pay

## 2022-01-05 ENCOUNTER — Ambulatory Visit
Admission: RE | Admit: 2022-01-05 | Discharge: 2022-01-05 | Disposition: A | Discharge status: Home / Self Care | Payer: PPO | Source: Ambulatory Visit | Attending: Radiation Oncology | Admitting: Radiation Oncology

## 2022-01-05 DIAGNOSIS — Z191 Hormone sensitive malignancy status: Secondary | ICD-10-CM | POA: Diagnosis not present

## 2022-01-05 DIAGNOSIS — C61 Malignant neoplasm of prostate: Secondary | ICD-10-CM | POA: Diagnosis not present

## 2022-01-05 DIAGNOSIS — Z510 Encounter for antineoplastic radiation therapy: Secondary | ICD-10-CM | POA: Insufficient documentation

## 2022-01-06 DIAGNOSIS — N3281 Overactive bladder: Secondary | ICD-10-CM | POA: Diagnosis not present

## 2022-01-06 DIAGNOSIS — C61 Malignant neoplasm of prostate: Secondary | ICD-10-CM | POA: Diagnosis not present

## 2022-01-07 NOTE — Progress Notes (Signed)
  Radiation Oncology         [phone]) [phone] ________________________________  Name: Jared Shelton. MRN: [id number]  Date: 01/05/2022  DOB: 03/02/1941  SIMULATION AND TREATMENT PLANNING NOTE    ICD-10-CM   1. Malignant neoplasm of prostate ([place])  C61       DIAGNOSIS:  81 y.o. 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  NARRATIVE:  The patient was brought to the [facility].  Identity was confirmed.  All relevant records and images related to the planned course of therapy were reviewed.  The patient freely provided informed written consent to proceed with treatment after reviewing the details related to the planned course of therapy. The consent form was witnessed and verified by the simulation staff.  Then, the patient was set-up in a stable reproducible supine position for radiation therapy.  A vacuum lock pillow device was custom fabricated to position his legs in a reproducible immobilized position.  Then, I performed a urethrogram under sterile conditions to identify the prostatic bed.  CT images were obtained.  Surface markings were placed.  The CT images were loaded into the planning software.  Then the prostate bed target, pelvic lymph node target and avoidance structures including the rectum, bladder, bowel and hips were contoured.  Treatment planning then occurred.  The radiation prescription was entered and confirmed.  A total of one complex treatment devices were fabricated. I have requested : Intensity Modulated Radiotherapy (IMRT) is medically necessary for this case for the following reason:  Rectal sparing.Marland Kitchen  PLAN:  The patient will receive 45 Gy in 25 fractions of 1.8 Gy, followed by a boost to the prostate to a total dose of 75 Gy with 15 additional fractions of 2 Gy.   ________________________________  Sheral Apley Tammi Klippel, M.D.

## 2022-01-16 DIAGNOSIS — C61 Malignant neoplasm of prostate: Secondary | ICD-10-CM | POA: Diagnosis not present

## 2022-01-16 DIAGNOSIS — Z191 Hormone sensitive malignancy status: Secondary | ICD-10-CM | POA: Diagnosis not present

## 2022-01-16 DIAGNOSIS — Z510 Encounter for antineoplastic radiation therapy: Secondary | ICD-10-CM | POA: Diagnosis not present

## 2022-01-17 ENCOUNTER — Other Ambulatory Visit: Payer: Self-pay

## 2022-01-17 ENCOUNTER — Ambulatory Visit
Admission: RE | Admit: 2022-01-17 | Discharge: 2022-01-17 | Disposition: A | Discharge status: Home / Self Care | Payer: PPO | Source: Ambulatory Visit | Attending: Radiation Oncology | Admitting: Radiation Oncology

## 2022-01-17 DIAGNOSIS — C61 Malignant neoplasm of prostate: Secondary | ICD-10-CM | POA: Diagnosis not present

## 2022-01-17 DIAGNOSIS — Z191 Hormone sensitive malignancy status: Secondary | ICD-10-CM | POA: Diagnosis not present

## 2022-01-17 DIAGNOSIS — Z510 Encounter for antineoplastic radiation therapy: Secondary | ICD-10-CM | POA: Diagnosis not present

## 2022-01-17 LAB — RAD ONC ARIA SESSION SUMMARY
Course Elapsed Days: 0
Plan Fractions Treated to Date: 1
Plan Prescribed Dose Per Fraction: 1.8 Gy
Plan Total Fractions Prescribed: 25
Plan Total Prescribed Dose: 45 Gy
Reference Point Dosage Given to Date: 1.8 Gy
Reference Point Session Dosage Given: 1.8 Gy
Session Number: 1

## 2022-01-18 ENCOUNTER — Ambulatory Visit
Admission: RE | Admit: 2022-01-18 | Discharge: 2022-01-18 | Disposition: A | Discharge status: Home / Self Care | Payer: PPO | Source: Ambulatory Visit | Attending: Radiation Oncology | Admitting: Radiation Oncology

## 2022-01-18 ENCOUNTER — Other Ambulatory Visit: Payer: Self-pay

## 2022-01-18 DIAGNOSIS — Z191 Hormone sensitive malignancy status: Secondary | ICD-10-CM | POA: Diagnosis not present

## 2022-01-18 DIAGNOSIS — C61 Malignant neoplasm of prostate: Secondary | ICD-10-CM | POA: Diagnosis not present

## 2022-01-18 DIAGNOSIS — Z510 Encounter for antineoplastic radiation therapy: Secondary | ICD-10-CM | POA: Diagnosis not present

## 2022-01-18 LAB — RAD ONC ARIA SESSION SUMMARY
Course Elapsed Days: 1
Plan Fractions Treated to Date: 2
Plan Prescribed Dose Per Fraction: 1.8 Gy
Plan Total Fractions Prescribed: 25
Plan Total Prescribed Dose: 45 Gy
Reference Point Dosage Given to Date: 3.6 Gy
Reference Point Session Dosage Given: 1.8 Gy
Session Number: 2

## 2022-01-19 ENCOUNTER — Other Ambulatory Visit: Payer: Self-pay

## 2022-01-19 ENCOUNTER — Ambulatory Visit
Admission: RE | Admit: 2022-01-19 | Discharge: 2022-01-19 | Disposition: A | Discharge status: Home / Self Care | Payer: PPO | Source: Ambulatory Visit | Attending: Radiation Oncology | Admitting: Radiation Oncology

## 2022-01-19 DIAGNOSIS — Z191 Hormone sensitive malignancy status: Secondary | ICD-10-CM | POA: Diagnosis not present

## 2022-01-19 DIAGNOSIS — C61 Malignant neoplasm of prostate: Secondary | ICD-10-CM | POA: Diagnosis not present

## 2022-01-19 DIAGNOSIS — Z510 Encounter for antineoplastic radiation therapy: Secondary | ICD-10-CM | POA: Diagnosis not present

## 2022-01-19 LAB — RAD ONC ARIA SESSION SUMMARY
Course Elapsed Days: 2
Plan Fractions Treated to Date: 3
Plan Prescribed Dose Per Fraction: 1.8 Gy
Plan Total Fractions Prescribed: 25
Plan Total Prescribed Dose: 45 Gy
Reference Point Dosage Given to Date: 5.4 Gy
Reference Point Session Dosage Given: 1.8 Gy
Session Number: 3

## 2022-01-20 ENCOUNTER — Other Ambulatory Visit: Payer: Self-pay

## 2022-01-20 ENCOUNTER — Ambulatory Visit
Admission: RE | Admit: 2022-01-20 | Discharge: 2022-01-20 | Disposition: A | Discharge status: Home / Self Care | Payer: PPO | Source: Ambulatory Visit | Attending: Radiation Oncology | Admitting: Radiation Oncology

## 2022-01-20 DIAGNOSIS — C61 Malignant neoplasm of prostate: Secondary | ICD-10-CM | POA: Diagnosis not present

## 2022-01-20 DIAGNOSIS — Z510 Encounter for antineoplastic radiation therapy: Secondary | ICD-10-CM | POA: Diagnosis not present

## 2022-01-20 DIAGNOSIS — Z191 Hormone sensitive malignancy status: Secondary | ICD-10-CM | POA: Diagnosis not present

## 2022-01-20 LAB — RAD ONC ARIA SESSION SUMMARY
Course Elapsed Days: 3
Plan Fractions Treated to Date: 4
Plan Prescribed Dose Per Fraction: 1.8 Gy
Plan Total Fractions Prescribed: 25
Plan Total Prescribed Dose: 45 Gy
Reference Point Dosage Given to Date: 7.2 Gy
Reference Point Session Dosage Given: 1.8 Gy
Session Number: 4

## 2022-01-23 ENCOUNTER — Ambulatory Visit
Admission: RE | Admit: 2022-01-23 | Discharge: 2022-01-23 | Disposition: A | Discharge status: Home / Self Care | Payer: PPO | Source: Ambulatory Visit | Attending: Radiation Oncology | Admitting: Radiation Oncology

## 2022-01-23 ENCOUNTER — Other Ambulatory Visit: Payer: Self-pay

## 2022-01-23 DIAGNOSIS — Z191 Hormone sensitive malignancy status: Secondary | ICD-10-CM | POA: Diagnosis not present

## 2022-01-23 DIAGNOSIS — C61 Malignant neoplasm of prostate: Secondary | ICD-10-CM | POA: Diagnosis not present

## 2022-01-23 DIAGNOSIS — Z510 Encounter for antineoplastic radiation therapy: Secondary | ICD-10-CM | POA: Diagnosis not present

## 2022-01-23 LAB — RAD ONC ARIA SESSION SUMMARY
Course Elapsed Days: 6
Plan Fractions Treated to Date: 5
Plan Prescribed Dose Per Fraction: 1.8 Gy
Plan Total Fractions Prescribed: 25
Plan Total Prescribed Dose: 45 Gy
Reference Point Dosage Given to Date: 9 Gy
Reference Point Session Dosage Given: 1.8 Gy
Session Number: 5

## 2022-01-24 ENCOUNTER — Ambulatory Visit
Admission: RE | Admit: 2022-01-24 | Discharge: 2022-01-24 | Disposition: A | Discharge status: Home / Self Care | Payer: PPO | Source: Ambulatory Visit | Attending: Radiation Oncology | Admitting: Radiation Oncology

## 2022-01-24 ENCOUNTER — Other Ambulatory Visit: Payer: Self-pay

## 2022-01-24 DIAGNOSIS — Z191 Hormone sensitive malignancy status: Secondary | ICD-10-CM | POA: Diagnosis not present

## 2022-01-24 DIAGNOSIS — C61 Malignant neoplasm of prostate: Secondary | ICD-10-CM | POA: Diagnosis not present

## 2022-01-24 DIAGNOSIS — Z510 Encounter for antineoplastic radiation therapy: Secondary | ICD-10-CM | POA: Diagnosis not present

## 2022-01-24 LAB — RAD ONC ARIA SESSION SUMMARY
Course Elapsed Days: 7
Plan Fractions Treated to Date: 6
Plan Prescribed Dose Per Fraction: 1.8 Gy
Plan Total Fractions Prescribed: 25
Plan Total Prescribed Dose: 45 Gy
Reference Point Dosage Given to Date: 10.8 Gy
Reference Point Session Dosage Given: 1.8 Gy
Session Number: 6

## 2022-01-25 ENCOUNTER — Other Ambulatory Visit: Payer: Self-pay

## 2022-01-25 ENCOUNTER — Ambulatory Visit
Admission: RE | Admit: 2022-01-25 | Discharge: 2022-01-25 | Disposition: A | Discharge status: Home / Self Care | Payer: PPO | Source: Ambulatory Visit | Attending: Radiation Oncology | Admitting: Radiation Oncology

## 2022-01-25 DIAGNOSIS — Z510 Encounter for antineoplastic radiation therapy: Secondary | ICD-10-CM | POA: Diagnosis not present

## 2022-01-25 DIAGNOSIS — C61 Malignant neoplasm of prostate: Secondary | ICD-10-CM | POA: Diagnosis not present

## 2022-01-25 DIAGNOSIS — Z191 Hormone sensitive malignancy status: Secondary | ICD-10-CM | POA: Diagnosis not present

## 2022-01-25 LAB — RAD ONC ARIA SESSION SUMMARY
Course Elapsed Days: 8
Plan Fractions Treated to Date: 7
Plan Prescribed Dose Per Fraction: 1.8 Gy
Plan Total Fractions Prescribed: 25
Plan Total Prescribed Dose: 45 Gy
Reference Point Dosage Given to Date: 12.6 Gy
Reference Point Session Dosage Given: 1.8 Gy
Session Number: 7

## 2022-01-26 ENCOUNTER — Ambulatory Visit
Admission: RE | Admit: 2022-01-26 | Discharge: 2022-01-26 | Disposition: A | Discharge status: Home / Self Care | Payer: PPO | Source: Ambulatory Visit | Attending: Radiation Oncology | Admitting: Radiation Oncology

## 2022-01-26 ENCOUNTER — Other Ambulatory Visit: Payer: Self-pay

## 2022-01-26 DIAGNOSIS — Z510 Encounter for antineoplastic radiation therapy: Secondary | ICD-10-CM | POA: Diagnosis not present

## 2022-01-26 DIAGNOSIS — Z191 Hormone sensitive malignancy status: Secondary | ICD-10-CM | POA: Diagnosis not present

## 2022-01-26 DIAGNOSIS — C61 Malignant neoplasm of prostate: Secondary | ICD-10-CM | POA: Diagnosis not present

## 2022-01-26 LAB — RAD ONC ARIA SESSION SUMMARY
Course Elapsed Days: 9
Plan Fractions Treated to Date: 8
Plan Prescribed Dose Per Fraction: 1.8 Gy
Plan Total Fractions Prescribed: 25
Plan Total Prescribed Dose: 45 Gy
Reference Point Dosage Given to Date: 14.4 Gy
Reference Point Session Dosage Given: 1.8 Gy
Session Number: 8

## 2022-01-27 ENCOUNTER — Other Ambulatory Visit: Payer: Self-pay

## 2022-01-27 ENCOUNTER — Ambulatory Visit
Admission: RE | Admit: 2022-01-27 | Discharge: 2022-01-27 | Disposition: A | Discharge status: Home / Self Care | Payer: PPO | Source: Ambulatory Visit | Attending: Radiation Oncology | Admitting: Radiation Oncology

## 2022-01-27 DIAGNOSIS — C61 Malignant neoplasm of prostate: Secondary | ICD-10-CM | POA: Diagnosis not present

## 2022-01-27 DIAGNOSIS — Z191 Hormone sensitive malignancy status: Secondary | ICD-10-CM | POA: Diagnosis not present

## 2022-01-27 DIAGNOSIS — Z510 Encounter for antineoplastic radiation therapy: Secondary | ICD-10-CM | POA: Diagnosis not present

## 2022-01-27 LAB — RAD ONC ARIA SESSION SUMMARY
Course Elapsed Days: 10
Plan Fractions Treated to Date: 9
Plan Prescribed Dose Per Fraction: 1.8 Gy
Plan Total Fractions Prescribed: 25
Plan Total Prescribed Dose: 45 Gy
Reference Point Dosage Given to Date: 16.2 Gy
Reference Point Session Dosage Given: 1.8 Gy
Session Number: 9

## 2022-01-30 ENCOUNTER — Other Ambulatory Visit: Payer: Self-pay

## 2022-01-30 ENCOUNTER — Ambulatory Visit
Admission: RE | Admit: 2022-01-30 | Discharge: 2022-01-30 | Disposition: A | Discharge status: Home / Self Care | Payer: PPO | Source: Ambulatory Visit | Attending: Radiation Oncology | Admitting: Radiation Oncology

## 2022-01-30 DIAGNOSIS — C61 Malignant neoplasm of prostate: Secondary | ICD-10-CM | POA: Diagnosis not present

## 2022-01-30 DIAGNOSIS — Z191 Hormone sensitive malignancy status: Secondary | ICD-10-CM | POA: Diagnosis not present

## 2022-01-30 DIAGNOSIS — Z510 Encounter for antineoplastic radiation therapy: Secondary | ICD-10-CM | POA: Diagnosis not present

## 2022-01-30 LAB — RAD ONC ARIA SESSION SUMMARY
Course Elapsed Days: 13
Plan Fractions Treated to Date: 10
Plan Prescribed Dose Per Fraction: 1.8 Gy
Plan Total Fractions Prescribed: 25
Plan Total Prescribed Dose: 45 Gy
Reference Point Dosage Given to Date: 18 Gy
Reference Point Session Dosage Given: 1.8 Gy
Session Number: 10

## 2022-01-31 ENCOUNTER — Ambulatory Visit
Admission: RE | Admit: 2022-01-31 | Discharge: 2022-01-31 | Disposition: A | Discharge status: Home / Self Care | Payer: PPO | Source: Ambulatory Visit | Attending: Radiation Oncology | Admitting: Radiation Oncology

## 2022-01-31 ENCOUNTER — Other Ambulatory Visit: Payer: Self-pay

## 2022-01-31 DIAGNOSIS — Z191 Hormone sensitive malignancy status: Secondary | ICD-10-CM | POA: Diagnosis not present

## 2022-01-31 DIAGNOSIS — C61 Malignant neoplasm of prostate: Secondary | ICD-10-CM | POA: Diagnosis not present

## 2022-01-31 DIAGNOSIS — Z510 Encounter for antineoplastic radiation therapy: Secondary | ICD-10-CM | POA: Diagnosis not present

## 2022-01-31 LAB — RAD ONC ARIA SESSION SUMMARY
Course Elapsed Days: 14
Plan Fractions Treated to Date: 11
Plan Prescribed Dose Per Fraction: 1.8 Gy
Plan Total Fractions Prescribed: 25
Plan Total Prescribed Dose: 45 Gy
Reference Point Dosage Given to Date: 19.8 Gy
Reference Point Session Dosage Given: 1.8 Gy
Session Number: 11

## 2022-02-01 ENCOUNTER — Other Ambulatory Visit: Payer: Self-pay

## 2022-02-01 ENCOUNTER — Ambulatory Visit
Admission: RE | Admit: 2022-02-01 | Discharge: 2022-02-01 | Disposition: A | Discharge status: Home / Self Care | Payer: PPO | Source: Ambulatory Visit | Attending: Radiation Oncology | Admitting: Radiation Oncology

## 2022-02-01 DIAGNOSIS — C61 Malignant neoplasm of prostate: Secondary | ICD-10-CM | POA: Diagnosis not present

## 2022-02-01 DIAGNOSIS — Z191 Hormone sensitive malignancy status: Secondary | ICD-10-CM | POA: Diagnosis not present

## 2022-02-01 DIAGNOSIS — Z510 Encounter for antineoplastic radiation therapy: Secondary | ICD-10-CM | POA: Diagnosis not present

## 2022-02-01 LAB — RAD ONC ARIA SESSION SUMMARY
Course Elapsed Days: 15
Plan Fractions Treated to Date: 12
Plan Prescribed Dose Per Fraction: 1.8 Gy
Plan Total Fractions Prescribed: 25
Plan Total Prescribed Dose: 45 Gy
Reference Point Dosage Given to Date: 21.6 Gy
Reference Point Session Dosage Given: 1.8 Gy
Session Number: 12

## 2022-02-02 ENCOUNTER — Ambulatory Visit
Admission: RE | Admit: 2022-02-02 | Discharge: 2022-02-02 | Disposition: A | Discharge status: Home / Self Care | Payer: PPO | Source: Ambulatory Visit | Attending: Radiation Oncology | Admitting: Radiation Oncology

## 2022-02-02 ENCOUNTER — Other Ambulatory Visit: Payer: Self-pay

## 2022-02-02 DIAGNOSIS — Z191 Hormone sensitive malignancy status: Secondary | ICD-10-CM | POA: Diagnosis not present

## 2022-02-02 DIAGNOSIS — C61 Malignant neoplasm of prostate: Secondary | ICD-10-CM | POA: Diagnosis not present

## 2022-02-02 DIAGNOSIS — Z510 Encounter for antineoplastic radiation therapy: Secondary | ICD-10-CM | POA: Diagnosis not present

## 2022-02-02 LAB — RAD ONC ARIA SESSION SUMMARY
Course Elapsed Days: 16
Plan Fractions Treated to Date: 13
Plan Prescribed Dose Per Fraction: 1.8 Gy
Plan Total Fractions Prescribed: 25
Plan Total Prescribed Dose: 45 Gy
Reference Point Dosage Given to Date: 23.4 Gy
Reference Point Session Dosage Given: 1.8 Gy
Session Number: 13

## 2022-02-03 ENCOUNTER — Other Ambulatory Visit: Payer: Self-pay

## 2022-02-03 ENCOUNTER — Ambulatory Visit
Admission: RE | Admit: 2022-02-03 | Discharge: 2022-02-03 | Disposition: A | Discharge status: Home / Self Care | Payer: PPO | Source: Ambulatory Visit | Attending: Radiation Oncology | Admitting: Radiation Oncology

## 2022-02-03 DIAGNOSIS — C61 Malignant neoplasm of prostate: Secondary | ICD-10-CM | POA: Diagnosis not present

## 2022-02-03 DIAGNOSIS — Z191 Hormone sensitive malignancy status: Secondary | ICD-10-CM | POA: Diagnosis not present

## 2022-02-03 DIAGNOSIS — Z510 Encounter for antineoplastic radiation therapy: Secondary | ICD-10-CM | POA: Diagnosis not present

## 2022-02-03 LAB — RAD ONC ARIA SESSION SUMMARY
Course Elapsed Days: 17
Plan Fractions Treated to Date: 14
Plan Prescribed Dose Per Fraction: 1.8 Gy
Plan Total Fractions Prescribed: 25
Plan Total Prescribed Dose: 45 Gy
Reference Point Dosage Given to Date: 25.2 Gy
Reference Point Session Dosage Given: 1.8 Gy
Session Number: 14

## 2022-02-06 ENCOUNTER — Ambulatory Visit
Admission: RE | Admit: 2022-02-06 | Discharge: 2022-02-06 | Disposition: A | Discharge status: Home / Self Care | Payer: PPO | Source: Ambulatory Visit | Attending: Radiation Oncology | Admitting: Radiation Oncology

## 2022-02-06 ENCOUNTER — Other Ambulatory Visit: Payer: Self-pay

## 2022-02-06 DIAGNOSIS — C61 Malignant neoplasm of prostate: Secondary | ICD-10-CM | POA: Diagnosis not present

## 2022-02-06 DIAGNOSIS — Z510 Encounter for antineoplastic radiation therapy: Secondary | ICD-10-CM | POA: Diagnosis not present

## 2022-02-06 DIAGNOSIS — Z191 Hormone sensitive malignancy status: Secondary | ICD-10-CM | POA: Diagnosis not present

## 2022-02-06 LAB — RAD ONC ARIA SESSION SUMMARY
Course Elapsed Days: 20
Plan Fractions Treated to Date: 15
Plan Prescribed Dose Per Fraction: 1.8 Gy
Plan Total Fractions Prescribed: 25
Plan Total Prescribed Dose: 45 Gy
Reference Point Dosage Given to Date: 27 Gy
Reference Point Session Dosage Given: 1.8 Gy
Session Number: 15

## 2022-02-07 ENCOUNTER — Other Ambulatory Visit: Payer: Self-pay

## 2022-02-07 ENCOUNTER — Ambulatory Visit
Admission: RE | Admit: 2022-02-07 | Discharge: 2022-02-07 | Disposition: A | Discharge status: Home / Self Care | Payer: PPO | Source: Ambulatory Visit | Attending: Radiation Oncology | Admitting: Radiation Oncology

## 2022-02-07 DIAGNOSIS — Z510 Encounter for antineoplastic radiation therapy: Secondary | ICD-10-CM | POA: Diagnosis not present

## 2022-02-07 DIAGNOSIS — N403 Nodular prostate with lower urinary tract symptoms: Secondary | ICD-10-CM | POA: Diagnosis not present

## 2022-02-07 DIAGNOSIS — R3915 Urgency of urination: Secondary | ICD-10-CM | POA: Diagnosis not present

## 2022-02-07 DIAGNOSIS — C61 Malignant neoplasm of prostate: Secondary | ICD-10-CM | POA: Diagnosis not present

## 2022-02-07 DIAGNOSIS — Z191 Hormone sensitive malignancy status: Secondary | ICD-10-CM | POA: Diagnosis not present

## 2022-02-07 DIAGNOSIS — N451 Epididymitis: Secondary | ICD-10-CM | POA: Diagnosis not present

## 2022-02-07 LAB — RAD ONC ARIA SESSION SUMMARY
Course Elapsed Days: 21
Plan Fractions Treated to Date: 16
Plan Prescribed Dose Per Fraction: 1.8 Gy
Plan Total Fractions Prescribed: 25
Plan Total Prescribed Dose: 45 Gy
Reference Point Dosage Given to Date: 28.8 Gy
Reference Point Session Dosage Given: 1.8 Gy
Session Number: 16

## 2022-02-08 ENCOUNTER — Ambulatory Visit
Admission: RE | Admit: 2022-02-08 | Discharge: 2022-02-08 | Disposition: A | Discharge status: Home / Self Care | Payer: PPO | Source: Ambulatory Visit | Attending: Radiation Oncology | Admitting: Radiation Oncology

## 2022-02-08 ENCOUNTER — Other Ambulatory Visit: Payer: Self-pay

## 2022-02-08 DIAGNOSIS — Z510 Encounter for antineoplastic radiation therapy: Secondary | ICD-10-CM | POA: Diagnosis not present

## 2022-02-08 DIAGNOSIS — C61 Malignant neoplasm of prostate: Secondary | ICD-10-CM | POA: Diagnosis not present

## 2022-02-08 DIAGNOSIS — Z191 Hormone sensitive malignancy status: Secondary | ICD-10-CM | POA: Diagnosis not present

## 2022-02-08 LAB — RAD ONC ARIA SESSION SUMMARY
Course Elapsed Days: 22
Plan Fractions Treated to Date: 17
Plan Prescribed Dose Per Fraction: 1.8 Gy
Plan Total Fractions Prescribed: 25
Plan Total Prescribed Dose: 45 Gy
Reference Point Dosage Given to Date: 30.6 Gy
Reference Point Session Dosage Given: 1.8 Gy
Session Number: 17

## 2022-02-09 ENCOUNTER — Ambulatory Visit
Admission: RE | Admit: 2022-02-09 | Discharge: 2022-02-09 | Disposition: A | Discharge status: Home / Self Care | Payer: PPO | Source: Ambulatory Visit | Attending: Radiation Oncology | Admitting: Radiation Oncology

## 2022-02-09 ENCOUNTER — Other Ambulatory Visit: Payer: Self-pay

## 2022-02-09 DIAGNOSIS — Z510 Encounter for antineoplastic radiation therapy: Secondary | ICD-10-CM | POA: Diagnosis not present

## 2022-02-09 DIAGNOSIS — Z191 Hormone sensitive malignancy status: Secondary | ICD-10-CM | POA: Diagnosis not present

## 2022-02-09 DIAGNOSIS — C61 Malignant neoplasm of prostate: Secondary | ICD-10-CM | POA: Diagnosis not present

## 2022-02-09 LAB — RAD ONC ARIA SESSION SUMMARY
Course Elapsed Days: 23
Plan Fractions Treated to Date: 18
Plan Prescribed Dose Per Fraction: 1.8 Gy
Plan Total Fractions Prescribed: 25
Plan Total Prescribed Dose: 45 Gy
Reference Point Dosage Given to Date: 32.4 Gy
Reference Point Session Dosage Given: 1.8 Gy
Session Number: 18

## 2022-02-10 ENCOUNTER — Other Ambulatory Visit: Payer: Self-pay

## 2022-02-10 ENCOUNTER — Ambulatory Visit
Admission: RE | Admit: 2022-02-10 | Discharge: 2022-02-10 | Disposition: A | Discharge status: Home / Self Care | Payer: PPO | Source: Ambulatory Visit | Attending: Radiation Oncology | Admitting: Radiation Oncology

## 2022-02-10 DIAGNOSIS — Z510 Encounter for antineoplastic radiation therapy: Secondary | ICD-10-CM | POA: Diagnosis not present

## 2022-02-10 DIAGNOSIS — C61 Malignant neoplasm of prostate: Secondary | ICD-10-CM | POA: Diagnosis not present

## 2022-02-10 DIAGNOSIS — Z191 Hormone sensitive malignancy status: Secondary | ICD-10-CM | POA: Diagnosis not present

## 2022-02-10 LAB — RAD ONC ARIA SESSION SUMMARY
Course Elapsed Days: 24
Plan Fractions Treated to Date: 19
Plan Prescribed Dose Per Fraction: 1.8 Gy
Plan Total Fractions Prescribed: 25
Plan Total Prescribed Dose: 45 Gy
Reference Point Dosage Given to Date: 34.2 Gy
Reference Point Session Dosage Given: 1.8 Gy
Session Number: 19

## 2022-02-13 ENCOUNTER — Other Ambulatory Visit: Payer: Self-pay

## 2022-02-13 ENCOUNTER — Ambulatory Visit
Admission: RE | Admit: 2022-02-13 | Discharge: 2022-02-13 | Disposition: A | Discharge status: Home / Self Care | Payer: PPO | Source: Ambulatory Visit | Attending: Radiation Oncology | Admitting: Radiation Oncology

## 2022-02-13 DIAGNOSIS — C61 Malignant neoplasm of prostate: Secondary | ICD-10-CM | POA: Diagnosis not present

## 2022-02-13 DIAGNOSIS — Z191 Hormone sensitive malignancy status: Secondary | ICD-10-CM | POA: Diagnosis not present

## 2022-02-13 DIAGNOSIS — Z510 Encounter for antineoplastic radiation therapy: Secondary | ICD-10-CM | POA: Diagnosis not present

## 2022-02-13 LAB — RAD ONC ARIA SESSION SUMMARY
Course Elapsed Days: 27
Plan Fractions Treated to Date: 20
Plan Prescribed Dose Per Fraction: 1.8 Gy
Plan Total Fractions Prescribed: 25
Plan Total Prescribed Dose: 45 Gy
Reference Point Dosage Given to Date: 36 Gy
Reference Point Session Dosage Given: 1.8 Gy
Session Number: 20

## 2022-02-14 ENCOUNTER — Other Ambulatory Visit: Payer: Self-pay

## 2022-02-14 ENCOUNTER — Ambulatory Visit
Admission: RE | Admit: 2022-02-14 | Discharge: 2022-02-14 | Disposition: A | Discharge status: Home / Self Care | Payer: PPO | Source: Ambulatory Visit | Attending: Radiation Oncology | Admitting: Radiation Oncology

## 2022-02-14 DIAGNOSIS — Z510 Encounter for antineoplastic radiation therapy: Secondary | ICD-10-CM | POA: Diagnosis not present

## 2022-02-14 DIAGNOSIS — Z191 Hormone sensitive malignancy status: Secondary | ICD-10-CM | POA: Diagnosis not present

## 2022-02-14 DIAGNOSIS — C61 Malignant neoplasm of prostate: Secondary | ICD-10-CM | POA: Diagnosis not present

## 2022-02-14 LAB — RAD ONC ARIA SESSION SUMMARY
Course Elapsed Days: 28
Plan Fractions Treated to Date: 21
Plan Prescribed Dose Per Fraction: 1.8 Gy
Plan Total Fractions Prescribed: 25
Plan Total Prescribed Dose: 45 Gy
Reference Point Dosage Given to Date: 37.8 Gy
Reference Point Session Dosage Given: 1.8 Gy
Session Number: 21

## 2022-02-15 ENCOUNTER — Other Ambulatory Visit: Payer: Self-pay

## 2022-02-15 ENCOUNTER — Ambulatory Visit
Admission: RE | Admit: 2022-02-15 | Discharge: 2022-02-15 | Disposition: A | Discharge status: Home / Self Care | Payer: PPO | Source: Ambulatory Visit | Attending: Radiation Oncology | Admitting: Radiation Oncology

## 2022-02-15 ENCOUNTER — Ambulatory Visit: Payer: PPO

## 2022-02-15 DIAGNOSIS — Z191 Hormone sensitive malignancy status: Secondary | ICD-10-CM | POA: Diagnosis not present

## 2022-02-15 DIAGNOSIS — C61 Malignant neoplasm of prostate: Secondary | ICD-10-CM | POA: Diagnosis not present

## 2022-02-15 DIAGNOSIS — Z510 Encounter for antineoplastic radiation therapy: Secondary | ICD-10-CM | POA: Diagnosis not present

## 2022-02-15 LAB — RAD ONC ARIA SESSION SUMMARY
Course Elapsed Days: 29
Plan Fractions Treated to Date: 22
Plan Prescribed Dose Per Fraction: 1.8 Gy
Plan Total Fractions Prescribed: 25
Plan Total Prescribed Dose: 45 Gy
Reference Point Dosage Given to Date: 39.6 Gy
Reference Point Session Dosage Given: 1.8 Gy
Session Number: 22

## 2022-02-18 DIAGNOSIS — Z9181 History of falling: Secondary | ICD-10-CM | POA: Diagnosis not present

## 2022-02-18 DIAGNOSIS — C61 Malignant neoplasm of prostate: Secondary | ICD-10-CM | POA: Diagnosis not present

## 2022-02-18 DIAGNOSIS — I10 Essential (primary) hypertension: Secondary | ICD-10-CM | POA: Diagnosis not present

## 2022-02-18 DIAGNOSIS — Z6828 Body mass index (BMI) 28.0-28.9, adult: Secondary | ICD-10-CM | POA: Diagnosis not present

## 2022-02-18 DIAGNOSIS — R21 Rash and other nonspecific skin eruption: Secondary | ICD-10-CM | POA: Diagnosis not present

## 2022-02-20 ENCOUNTER — Ambulatory Visit
Admission: RE | Admit: 2022-02-20 | Discharge: 2022-02-20 | Disposition: A | Discharge status: Home / Self Care | Payer: PPO | Source: Ambulatory Visit | Attending: Radiation Oncology | Admitting: Radiation Oncology

## 2022-02-20 ENCOUNTER — Other Ambulatory Visit: Payer: Self-pay

## 2022-02-20 DIAGNOSIS — C61 Malignant neoplasm of prostate: Secondary | ICD-10-CM | POA: Diagnosis not present

## 2022-02-20 DIAGNOSIS — M542 Cervicalgia: Secondary | ICD-10-CM | POA: Diagnosis not present

## 2022-02-20 DIAGNOSIS — Z191 Hormone sensitive malignancy status: Secondary | ICD-10-CM | POA: Diagnosis not present

## 2022-02-20 DIAGNOSIS — M545 Low back pain, unspecified: Secondary | ICD-10-CM | POA: Diagnosis not present

## 2022-02-20 DIAGNOSIS — Z510 Encounter for antineoplastic radiation therapy: Secondary | ICD-10-CM | POA: Diagnosis not present

## 2022-02-20 LAB — RAD ONC ARIA SESSION SUMMARY
Course Elapsed Days: 34
Plan Fractions Treated to Date: 23
Plan Prescribed Dose Per Fraction: 1.8 Gy
Plan Total Fractions Prescribed: 25
Plan Total Prescribed Dose: 45 Gy
Reference Point Dosage Given to Date: 41.4 Gy
Reference Point Session Dosage Given: 1.8 Gy
Session Number: 23

## 2022-02-21 ENCOUNTER — Other Ambulatory Visit: Payer: Self-pay

## 2022-02-21 ENCOUNTER — Ambulatory Visit
Admission: RE | Admit: 2022-02-21 | Discharge: 2022-02-21 | Disposition: A | Discharge status: Home / Self Care | Payer: PPO | Source: Ambulatory Visit | Attending: Radiation Oncology | Admitting: Radiation Oncology

## 2022-02-21 DIAGNOSIS — C61 Malignant neoplasm of prostate: Secondary | ICD-10-CM | POA: Diagnosis not present

## 2022-02-21 DIAGNOSIS — Z191 Hormone sensitive malignancy status: Secondary | ICD-10-CM | POA: Diagnosis not present

## 2022-02-21 DIAGNOSIS — Z510 Encounter for antineoplastic radiation therapy: Secondary | ICD-10-CM | POA: Diagnosis not present

## 2022-02-21 LAB — RAD ONC ARIA SESSION SUMMARY
Course Elapsed Days: 35
Plan Fractions Treated to Date: 24
Plan Prescribed Dose Per Fraction: 1.8 Gy
Plan Total Fractions Prescribed: 25
Plan Total Prescribed Dose: 45 Gy
Reference Point Dosage Given to Date: 43.2 Gy
Reference Point Session Dosage Given: 1.8 Gy
Session Number: 24

## 2022-02-22 ENCOUNTER — Other Ambulatory Visit: Payer: Self-pay

## 2022-02-22 ENCOUNTER — Ambulatory Visit
Admission: RE | Admit: 2022-02-22 | Discharge: 2022-02-22 | Disposition: A | Discharge status: Home / Self Care | Payer: PPO | Source: Ambulatory Visit | Attending: Radiation Oncology | Admitting: Radiation Oncology

## 2022-02-22 DIAGNOSIS — C61 Malignant neoplasm of prostate: Secondary | ICD-10-CM | POA: Diagnosis not present

## 2022-02-22 DIAGNOSIS — Z191 Hormone sensitive malignancy status: Secondary | ICD-10-CM | POA: Diagnosis not present

## 2022-02-22 DIAGNOSIS — Z510 Encounter for antineoplastic radiation therapy: Secondary | ICD-10-CM | POA: Diagnosis not present

## 2022-02-22 LAB — RAD ONC ARIA SESSION SUMMARY
Course Elapsed Days: 36
Plan Fractions Treated to Date: 25
Plan Prescribed Dose Per Fraction: 1.8 Gy
Plan Total Fractions Prescribed: 25
Plan Total Prescribed Dose: 45 Gy
Reference Point Dosage Given to Date: 45 Gy
Reference Point Session Dosage Given: 1.8 Gy
Session Number: 25

## 2022-02-23 ENCOUNTER — Ambulatory Visit
Admission: RE | Admit: 2022-02-23 | Discharge: 2022-02-23 | Disposition: A | Discharge status: Home / Self Care | Payer: PPO | Source: Ambulatory Visit | Attending: Radiation Oncology | Admitting: Radiation Oncology

## 2022-02-23 ENCOUNTER — Other Ambulatory Visit: Payer: Self-pay

## 2022-02-23 DIAGNOSIS — Z510 Encounter for antineoplastic radiation therapy: Secondary | ICD-10-CM | POA: Diagnosis not present

## 2022-02-23 DIAGNOSIS — C61 Malignant neoplasm of prostate: Secondary | ICD-10-CM | POA: Diagnosis not present

## 2022-02-23 DIAGNOSIS — Z191 Hormone sensitive malignancy status: Secondary | ICD-10-CM | POA: Diagnosis not present

## 2022-02-23 LAB — RAD ONC ARIA SESSION SUMMARY
Course Elapsed Days: 37
Plan Fractions Treated to Date: 1
Plan Prescribed Dose Per Fraction: 2 Gy
Plan Total Fractions Prescribed: 15
Plan Total Prescribed Dose: 30 Gy
Reference Point Dosage Given to Date: 2 Gy
Reference Point Session Dosage Given: 2 Gy
Session Number: 26

## 2022-02-24 ENCOUNTER — Other Ambulatory Visit: Payer: Self-pay

## 2022-02-24 ENCOUNTER — Ambulatory Visit
Admission: RE | Admit: 2022-02-24 | Discharge: 2022-02-24 | Disposition: A | Discharge status: Home / Self Care | Payer: PPO | Source: Ambulatory Visit | Attending: Radiation Oncology | Admitting: Radiation Oncology

## 2022-02-24 DIAGNOSIS — Z510 Encounter for antineoplastic radiation therapy: Secondary | ICD-10-CM | POA: Insufficient documentation

## 2022-02-24 DIAGNOSIS — C61 Malignant neoplasm of prostate: Secondary | ICD-10-CM | POA: Diagnosis not present

## 2022-02-24 DIAGNOSIS — Z191 Hormone sensitive malignancy status: Secondary | ICD-10-CM | POA: Diagnosis not present

## 2022-02-24 LAB — RAD ONC ARIA SESSION SUMMARY
Course Elapsed Days: 38
Plan Fractions Treated to Date: 2
Plan Prescribed Dose Per Fraction: 2 Gy
Plan Total Fractions Prescribed: 15
Plan Total Prescribed Dose: 30 Gy
Reference Point Dosage Given to Date: 4 Gy
Reference Point Session Dosage Given: 2 Gy
Session Number: 27

## 2022-02-27 ENCOUNTER — Other Ambulatory Visit: Payer: Self-pay

## 2022-02-27 ENCOUNTER — Ambulatory Visit
Admission: RE | Admit: 2022-02-27 | Discharge: 2022-02-27 | Disposition: A | Discharge status: Home / Self Care | Payer: PPO | Source: Ambulatory Visit | Attending: Radiation Oncology | Admitting: Radiation Oncology

## 2022-02-27 DIAGNOSIS — Z191 Hormone sensitive malignancy status: Secondary | ICD-10-CM | POA: Diagnosis not present

## 2022-02-27 DIAGNOSIS — C61 Malignant neoplasm of prostate: Secondary | ICD-10-CM | POA: Diagnosis not present

## 2022-02-27 DIAGNOSIS — Z510 Encounter for antineoplastic radiation therapy: Secondary | ICD-10-CM | POA: Diagnosis not present

## 2022-02-27 LAB — RAD ONC ARIA SESSION SUMMARY
Course Elapsed Days: 41
Plan Fractions Treated to Date: 3
Plan Prescribed Dose Per Fraction: 2 Gy
Plan Total Fractions Prescribed: 15
Plan Total Prescribed Dose: 30 Gy
Reference Point Dosage Given to Date: 6 Gy
Reference Point Session Dosage Given: 2 Gy
Session Number: 28

## 2022-02-28 ENCOUNTER — Other Ambulatory Visit: Payer: Self-pay

## 2022-02-28 ENCOUNTER — Ambulatory Visit
Admission: RE | Admit: 2022-02-28 | Discharge: 2022-02-28 | Disposition: A | Discharge status: Home / Self Care | Payer: PPO | Source: Ambulatory Visit | Attending: Radiation Oncology | Admitting: Radiation Oncology

## 2022-02-28 DIAGNOSIS — R351 Nocturia: Secondary | ICD-10-CM | POA: Diagnosis not present

## 2022-02-28 DIAGNOSIS — C61 Malignant neoplasm of prostate: Secondary | ICD-10-CM | POA: Diagnosis not present

## 2022-02-28 DIAGNOSIS — Z510 Encounter for antineoplastic radiation therapy: Secondary | ICD-10-CM | POA: Diagnosis not present

## 2022-02-28 DIAGNOSIS — N451 Epididymitis: Secondary | ICD-10-CM | POA: Diagnosis not present

## 2022-02-28 DIAGNOSIS — Z191 Hormone sensitive malignancy status: Secondary | ICD-10-CM | POA: Diagnosis not present

## 2022-02-28 DIAGNOSIS — R8271 Bacteriuria: Secondary | ICD-10-CM | POA: Diagnosis not present

## 2022-02-28 LAB — RAD ONC ARIA SESSION SUMMARY
Course Elapsed Days: 42
Plan Fractions Treated to Date: 4
Plan Prescribed Dose Per Fraction: 2 Gy
Plan Total Fractions Prescribed: 15
Plan Total Prescribed Dose: 30 Gy
Reference Point Dosage Given to Date: 8 Gy
Reference Point Session Dosage Given: 2 Gy
Session Number: 29

## 2022-03-01 ENCOUNTER — Other Ambulatory Visit: Payer: Self-pay

## 2022-03-01 ENCOUNTER — Ambulatory Visit
Admission: RE | Admit: 2022-03-01 | Discharge: 2022-03-01 | Disposition: A | Discharge status: Home / Self Care | Payer: PPO | Source: Ambulatory Visit | Attending: Radiation Oncology | Admitting: Radiation Oncology

## 2022-03-01 DIAGNOSIS — C61 Malignant neoplasm of prostate: Secondary | ICD-10-CM | POA: Diagnosis not present

## 2022-03-01 DIAGNOSIS — Z191 Hormone sensitive malignancy status: Secondary | ICD-10-CM | POA: Diagnosis not present

## 2022-03-01 DIAGNOSIS — Z510 Encounter for antineoplastic radiation therapy: Secondary | ICD-10-CM | POA: Diagnosis not present

## 2022-03-01 LAB — RAD ONC ARIA SESSION SUMMARY
Course Elapsed Days: 43
Plan Fractions Treated to Date: 5
Plan Prescribed Dose Per Fraction: 2 Gy
Plan Total Fractions Prescribed: 15
Plan Total Prescribed Dose: 30 Gy
Reference Point Dosage Given to Date: 10 Gy
Reference Point Session Dosage Given: 2 Gy
Session Number: 30

## 2022-03-02 ENCOUNTER — Other Ambulatory Visit: Payer: Self-pay

## 2022-03-02 ENCOUNTER — Ambulatory Visit
Admission: RE | Admit: 2022-03-02 | Discharge: 2022-03-02 | Disposition: A | Discharge status: Home / Self Care | Payer: PPO | Source: Ambulatory Visit | Attending: Radiation Oncology | Admitting: Radiation Oncology

## 2022-03-02 DIAGNOSIS — Z191 Hormone sensitive malignancy status: Secondary | ICD-10-CM | POA: Diagnosis not present

## 2022-03-02 DIAGNOSIS — Z510 Encounter for antineoplastic radiation therapy: Secondary | ICD-10-CM | POA: Diagnosis not present

## 2022-03-02 DIAGNOSIS — C61 Malignant neoplasm of prostate: Secondary | ICD-10-CM | POA: Diagnosis not present

## 2022-03-02 LAB — RAD ONC ARIA SESSION SUMMARY
Course Elapsed Days: 44
Plan Fractions Treated to Date: 6
Plan Prescribed Dose Per Fraction: 2 Gy
Plan Total Fractions Prescribed: 15
Plan Total Prescribed Dose: 30 Gy
Reference Point Dosage Given to Date: 12 Gy
Reference Point Session Dosage Given: 2 Gy
Session Number: 31

## 2022-03-03 ENCOUNTER — Ambulatory Visit
Admission: RE | Admit: 2022-03-03 | Discharge: 2022-03-03 | Disposition: A | Discharge status: Home / Self Care | Payer: PPO | Source: Ambulatory Visit | Attending: Radiation Oncology | Admitting: Radiation Oncology

## 2022-03-03 ENCOUNTER — Other Ambulatory Visit: Payer: Self-pay

## 2022-03-03 DIAGNOSIS — Z191 Hormone sensitive malignancy status: Secondary | ICD-10-CM | POA: Diagnosis not present

## 2022-03-03 DIAGNOSIS — Z510 Encounter for antineoplastic radiation therapy: Secondary | ICD-10-CM | POA: Diagnosis not present

## 2022-03-03 DIAGNOSIS — C61 Malignant neoplasm of prostate: Secondary | ICD-10-CM | POA: Diagnosis not present

## 2022-03-03 LAB — RAD ONC ARIA SESSION SUMMARY
Course Elapsed Days: 45
Plan Fractions Treated to Date: 7
Plan Prescribed Dose Per Fraction: 2 Gy
Plan Total Fractions Prescribed: 15
Plan Total Prescribed Dose: 30 Gy
Reference Point Dosage Given to Date: 14 Gy
Reference Point Session Dosage Given: 2 Gy
Session Number: 32

## 2022-03-06 ENCOUNTER — Other Ambulatory Visit: Payer: Self-pay

## 2022-03-06 ENCOUNTER — Ambulatory Visit
Admission: RE | Admit: 2022-03-06 | Discharge: 2022-03-06 | Disposition: A | Discharge status: Home / Self Care | Payer: PPO | Source: Ambulatory Visit | Attending: Radiation Oncology | Admitting: Radiation Oncology

## 2022-03-06 DIAGNOSIS — C61 Malignant neoplasm of prostate: Secondary | ICD-10-CM | POA: Diagnosis not present

## 2022-03-06 DIAGNOSIS — Z191 Hormone sensitive malignancy status: Secondary | ICD-10-CM | POA: Diagnosis not present

## 2022-03-06 DIAGNOSIS — Z510 Encounter for antineoplastic radiation therapy: Secondary | ICD-10-CM | POA: Diagnosis not present

## 2022-03-06 LAB — RAD ONC ARIA SESSION SUMMARY
Course Elapsed Days: 48
Plan Fractions Treated to Date: 8
Plan Prescribed Dose Per Fraction: 2 Gy
Plan Total Fractions Prescribed: 15
Plan Total Prescribed Dose: 30 Gy
Reference Point Dosage Given to Date: 16 Gy
Reference Point Session Dosage Given: 2 Gy
Session Number: 33

## 2022-03-07 ENCOUNTER — Other Ambulatory Visit: Payer: Self-pay

## 2022-03-07 ENCOUNTER — Ambulatory Visit
Admission: RE | Admit: 2022-03-07 | Discharge: 2022-03-07 | Disposition: A | Discharge status: Home / Self Care | Payer: PPO | Source: Ambulatory Visit | Attending: Radiation Oncology | Admitting: Radiation Oncology

## 2022-03-07 DIAGNOSIS — C61 Malignant neoplasm of prostate: Secondary | ICD-10-CM | POA: Diagnosis not present

## 2022-03-07 DIAGNOSIS — Z510 Encounter for antineoplastic radiation therapy: Secondary | ICD-10-CM | POA: Diagnosis not present

## 2022-03-07 DIAGNOSIS — Z191 Hormone sensitive malignancy status: Secondary | ICD-10-CM | POA: Diagnosis not present

## 2022-03-07 LAB — RAD ONC ARIA SESSION SUMMARY
Course Elapsed Days: 49
Plan Fractions Treated to Date: 9
Plan Prescribed Dose Per Fraction: 2 Gy
Plan Total Fractions Prescribed: 15
Plan Total Prescribed Dose: 30 Gy
Reference Point Dosage Given to Date: 18 Gy
Reference Point Session Dosage Given: 2 Gy
Session Number: 34

## 2022-03-08 ENCOUNTER — Ambulatory Visit
Admission: RE | Admit: 2022-03-08 | Discharge: 2022-03-08 | Disposition: A | Discharge status: Home / Self Care | Payer: PPO | Source: Ambulatory Visit | Attending: Radiation Oncology | Admitting: Radiation Oncology

## 2022-03-08 ENCOUNTER — Other Ambulatory Visit: Payer: Self-pay

## 2022-03-08 DIAGNOSIS — Z191 Hormone sensitive malignancy status: Secondary | ICD-10-CM | POA: Diagnosis not present

## 2022-03-08 DIAGNOSIS — Z510 Encounter for antineoplastic radiation therapy: Secondary | ICD-10-CM | POA: Diagnosis not present

## 2022-03-08 DIAGNOSIS — C61 Malignant neoplasm of prostate: Secondary | ICD-10-CM | POA: Diagnosis not present

## 2022-03-08 LAB — RAD ONC ARIA SESSION SUMMARY
Course Elapsed Days: 50
Plan Fractions Treated to Date: 10
Plan Prescribed Dose Per Fraction: 2 Gy
Plan Total Fractions Prescribed: 15
Plan Total Prescribed Dose: 30 Gy
Reference Point Dosage Given to Date: 20 Gy
Reference Point Session Dosage Given: 2 Gy
Session Number: 35

## 2022-03-09 ENCOUNTER — Ambulatory Visit
Admission: RE | Admit: 2022-03-09 | Discharge: 2022-03-09 | Disposition: A | Discharge status: Home / Self Care | Payer: PPO | Source: Ambulatory Visit | Attending: Radiation Oncology | Admitting: Radiation Oncology

## 2022-03-09 ENCOUNTER — Other Ambulatory Visit: Payer: Self-pay

## 2022-03-09 DIAGNOSIS — Z510 Encounter for antineoplastic radiation therapy: Secondary | ICD-10-CM | POA: Diagnosis not present

## 2022-03-09 DIAGNOSIS — Z191 Hormone sensitive malignancy status: Secondary | ICD-10-CM | POA: Diagnosis not present

## 2022-03-09 DIAGNOSIS — C61 Malignant neoplasm of prostate: Secondary | ICD-10-CM | POA: Diagnosis not present

## 2022-03-09 LAB — RAD ONC ARIA SESSION SUMMARY
Course Elapsed Days: 51
Plan Fractions Treated to Date: 11
Plan Prescribed Dose Per Fraction: 2 Gy
Plan Total Fractions Prescribed: 15
Plan Total Prescribed Dose: 30 Gy
Reference Point Dosage Given to Date: 22 Gy
Reference Point Session Dosage Given: 2 Gy
Session Number: 36

## 2022-03-10 ENCOUNTER — Other Ambulatory Visit: Payer: Self-pay

## 2022-03-10 ENCOUNTER — Ambulatory Visit
Admission: RE | Admit: 2022-03-10 | Discharge: 2022-03-10 | Disposition: A | Discharge status: Home / Self Care | Payer: PPO | Source: Ambulatory Visit | Attending: Radiation Oncology | Admitting: Radiation Oncology

## 2022-03-10 DIAGNOSIS — C61 Malignant neoplasm of prostate: Secondary | ICD-10-CM | POA: Diagnosis not present

## 2022-03-10 DIAGNOSIS — Z510 Encounter for antineoplastic radiation therapy: Secondary | ICD-10-CM | POA: Diagnosis not present

## 2022-03-10 DIAGNOSIS — Z191 Hormone sensitive malignancy status: Secondary | ICD-10-CM | POA: Diagnosis not present

## 2022-03-10 LAB — RAD ONC ARIA SESSION SUMMARY
Course Elapsed Days: 52
Plan Fractions Treated to Date: 12
Plan Prescribed Dose Per Fraction: 2 Gy
Plan Total Fractions Prescribed: 15
Plan Total Prescribed Dose: 30 Gy
Reference Point Dosage Given to Date: 24 Gy
Reference Point Session Dosage Given: 2 Gy
Session Number: 37

## 2022-03-13 ENCOUNTER — Ambulatory Visit
Admission: RE | Admit: 2022-03-13 | Discharge: 2022-03-13 | Disposition: A | Discharge status: Home / Self Care | Payer: PPO | Source: Ambulatory Visit | Attending: Radiation Oncology | Admitting: Radiation Oncology

## 2022-03-13 ENCOUNTER — Other Ambulatory Visit: Payer: Self-pay

## 2022-03-13 DIAGNOSIS — Z191 Hormone sensitive malignancy status: Secondary | ICD-10-CM | POA: Diagnosis not present

## 2022-03-13 DIAGNOSIS — C61 Malignant neoplasm of prostate: Secondary | ICD-10-CM | POA: Diagnosis not present

## 2022-03-13 DIAGNOSIS — Z510 Encounter for antineoplastic radiation therapy: Secondary | ICD-10-CM | POA: Diagnosis not present

## 2022-03-13 LAB — RAD ONC ARIA SESSION SUMMARY
Course Elapsed Days: 55
Plan Fractions Treated to Date: 13
Plan Prescribed Dose Per Fraction: 2 Gy
Plan Total Fractions Prescribed: 15
Plan Total Prescribed Dose: 30 Gy
Reference Point Dosage Given to Date: 26 Gy
Reference Point Session Dosage Given: 2 Gy
Session Number: 38

## 2022-03-14 ENCOUNTER — Ambulatory Visit
Admission: RE | Admit: 2022-03-14 | Discharge: 2022-03-14 | Disposition: A | Discharge status: Home / Self Care | Payer: PPO | Source: Ambulatory Visit | Attending: Radiation Oncology | Admitting: Radiation Oncology

## 2022-03-14 ENCOUNTER — Other Ambulatory Visit: Payer: Self-pay

## 2022-03-14 DIAGNOSIS — Z510 Encounter for antineoplastic radiation therapy: Secondary | ICD-10-CM | POA: Diagnosis not present

## 2022-03-14 DIAGNOSIS — Z191 Hormone sensitive malignancy status: Secondary | ICD-10-CM | POA: Diagnosis not present

## 2022-03-14 DIAGNOSIS — C61 Malignant neoplasm of prostate: Secondary | ICD-10-CM | POA: Diagnosis not present

## 2022-03-14 LAB — RAD ONC ARIA SESSION SUMMARY
Course Elapsed Days: 56
Plan Fractions Treated to Date: 14
Plan Prescribed Dose Per Fraction: 2 Gy
Plan Total Fractions Prescribed: 15
Plan Total Prescribed Dose: 30 Gy
Reference Point Dosage Given to Date: 28 Gy
Reference Point Session Dosage Given: 2 Gy
Session Number: 39

## 2022-03-15 ENCOUNTER — Other Ambulatory Visit: Payer: Self-pay

## 2022-03-15 ENCOUNTER — Encounter: Payer: Self-pay | Admitting: Urology

## 2022-03-15 ENCOUNTER — Ambulatory Visit
Admission: RE | Admit: 2022-03-15 | Discharge: 2022-03-15 | Disposition: A | Discharge status: Home / Self Care | Payer: PPO | Source: Ambulatory Visit | Attending: Radiation Oncology | Admitting: Radiation Oncology

## 2022-03-15 DIAGNOSIS — Z510 Encounter for antineoplastic radiation therapy: Secondary | ICD-10-CM | POA: Diagnosis not present

## 2022-03-15 DIAGNOSIS — Z191 Hormone sensitive malignancy status: Secondary | ICD-10-CM | POA: Diagnosis not present

## 2022-03-15 DIAGNOSIS — C61 Malignant neoplasm of prostate: Secondary | ICD-10-CM | POA: Diagnosis not present

## 2022-03-15 LAB — RAD ONC ARIA SESSION SUMMARY
Course Elapsed Days: 57
Plan Fractions Treated to Date: 15
Plan Prescribed Dose Per Fraction: 2 Gy
Plan Total Fractions Prescribed: 15
Plan Total Prescribed Dose: 30 Gy
Reference Point Dosage Given to Date: 30 Gy
Reference Point Session Dosage Given: 2 Gy
Session Number: 40

## 2022-03-16 DIAGNOSIS — C61 Malignant neoplasm of prostate: Secondary | ICD-10-CM | POA: Diagnosis not present

## 2022-03-28 NOTE — Progress Notes (Addendum)
Patient completed his radiation treatment for his stage T2a/T3 adenocarcinoma of the prostate on 03/15/2022.  Patient has upcoming follow up with Dr. Jeffie Pollock on 03/29/22 and telephone nurse follow up scheduled on 1/30.

## 2022-03-29 DIAGNOSIS — C61 Malignant neoplasm of prostate: Secondary | ICD-10-CM | POA: Diagnosis not present

## 2022-03-29 DIAGNOSIS — L719 Rosacea, unspecified: Secondary | ICD-10-CM | POA: Diagnosis not present

## 2022-03-29 DIAGNOSIS — N403 Nodular prostate with lower urinary tract symptoms: Secondary | ICD-10-CM | POA: Diagnosis not present

## 2022-03-29 DIAGNOSIS — I10 Essential (primary) hypertension: Secondary | ICD-10-CM | POA: Diagnosis not present

## 2022-03-29 DIAGNOSIS — L989 Disorder of the skin and subcutaneous tissue, unspecified: Secondary | ICD-10-CM | POA: Diagnosis not present

## 2022-03-29 DIAGNOSIS — W57XXXA Bitten or stung by nonvenomous insect and other nonvenomous arthropods, initial encounter: Secondary | ICD-10-CM | POA: Diagnosis not present

## 2022-03-29 DIAGNOSIS — N3281 Overactive bladder: Secondary | ICD-10-CM | POA: Diagnosis not present

## 2022-03-29 DIAGNOSIS — Z86018 Personal history of other benign neoplasm: Secondary | ICD-10-CM | POA: Diagnosis not present

## 2022-03-29 DIAGNOSIS — R4701 Aphasia: Secondary | ICD-10-CM | POA: Diagnosis not present

## 2022-04-04 DIAGNOSIS — C4441 Basal cell carcinoma of skin of scalp and neck: Secondary | ICD-10-CM | POA: Diagnosis not present

## 2022-04-04 DIAGNOSIS — L309 Dermatitis, unspecified: Secondary | ICD-10-CM | POA: Diagnosis not present

## 2022-04-04 DIAGNOSIS — C44619 Basal cell carcinoma of skin of left upper limb, including shoulder: Secondary | ICD-10-CM | POA: Diagnosis not present

## 2022-04-04 DIAGNOSIS — D485 Neoplasm of uncertain behavior of skin: Secondary | ICD-10-CM | POA: Diagnosis not present

## 2022-04-12 ENCOUNTER — Other Ambulatory Visit: Payer: Self-pay | Admitting: Neurological Surgery

## 2022-04-12 DIAGNOSIS — D320 Benign neoplasm of cerebral meninges: Secondary | ICD-10-CM

## 2022-04-13 NOTE — Progress Notes (Signed)
                                                                                                                                                             Patient Name: Jared Shelton MRN: [id number] DOB: [birth year] Referring Physician: Irine Seal (Profile Not Attached) Date of Service: 03/15/2022 [facility], [place]                                                        End Of Treatment Note  Diagnoses: C61-Malignant neoplasm of prostate  Cancer Staging: 82 y.o. 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   Intent: Curative  Radiation Treatment Dates: 01/17/2022 through 03/15/2022 Site Technique Total Dose (Gy) Dose per Fx (Gy) Completed Fx Beam Energies  Prostate: Prostate_Pelvis IMRT 45/45 1.8 25/25 6X  Prostate: Prostate_Bst_IMRT IMRT 30/30 2 15/15 6X   Narrative: The patient tolerated radiation therapy relatively well. He reported modest fatigue and increased nocturia 3-5x/night.  Plan: The patient will receive a call in about one month from the radiation oncology department. He will continue follow up with, his urologist, Dr. Jeffie Pollock as well.  ------------------------------------------------   Tyler Pita, MD [place]: [phone]  Fax: [phone] .com  Skype  LinkedIn

## 2022-04-20 DIAGNOSIS — B029 Zoster without complications: Secondary | ICD-10-CM | POA: Diagnosis not present

## 2022-04-20 DIAGNOSIS — R233 Spontaneous ecchymoses: Secondary | ICD-10-CM | POA: Diagnosis not present

## 2022-04-20 DIAGNOSIS — C44619 Basal cell carcinoma of skin of left upper limb, including shoulder: Secondary | ICD-10-CM | POA: Diagnosis not present

## 2022-04-24 NOTE — Progress Notes (Signed)
  Radiation Oncology         [phone] ________________________________  Name: Jared Shelton. MRN: [id number]  Date of Service: 04/25/2022  DOB: 07/25/1940  Post Treatment Telephone Note  Diagnosis:  82 y.o. 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   Intent: Curative  Radiation Treatment Dates: 01/17/2022 through 03/15/2022 Site Technique Total Dose (Gy) Dose per Fx (Gy) Completed Fx Beam Energies  Prostate: Prostate_Pelvis IMRT 45/45 1.8 25/25 6X  Prostate: Prostate_Bst_IMRT IMRT 30/30 2 15/15 6X   (as documented in provider EOT note)   Pre Treatment IPSS Score: 11 (as documented in the provider consult note)   The patient was available for call today.   Symptoms of fatigue have not improved since completing therapy.  Symptoms of bladder changes have not improved since completing therapy. Current symptoms include polyuria, and medications for bladder symptoms include none.  Symptoms of bowel changes have not improved since completing therapy. Current symptoms include diarrhea w/ occasional constipation, and medications for bowel symptoms include Miralax.     Post Treatment IPSS Score: IPSS Questionnaire (AUA-7): Over the past month.   1)  How often have you had a sensation of not emptying your bladder completely after you finish urinating?  4 - More than half the time  2)  How often have you had to urinate again less than two hours after you finished urinating? 5 - Almost always  3)  How often have you found you stopped and started again several times when you urinated?  0 - Not at all  4) How difficult have you found it to postpone urination?  0 - Not at all  5) How often have you had a weak urinary stream?  3 - About half the time  6) How often have you had to push or strain to begin urination?  1 - Less than 1 time in 5  7) How many times did you most typically  get up to urinate from the time you went to bed until the time you got up in the morning?  3 - 3 times  Total score:  16. Which indicates moderate symptoms  0-7 mildly symptomatic   8-19 moderately symptomatic   20-35 severely symptomatic    Patient has a scheduled follow up visit with his urologist, Dr. Jeffie Pollock, on 06/14/2022 at [place] for ongoing surveillance. He was counseled that PSA levels will be drawn in the urology office, and was reassured that additional time is expected to improve bowel and bladder symptoms. He was encouraged to call back with concerns or questions regarding radiation.   This concludes the interview.   Leandra Kern, LPN

## 2022-04-25 ENCOUNTER — Ambulatory Visit
Admission: RE | Admit: 2022-04-25 | Discharge: 2022-04-25 | Disposition: A | Discharge status: Home / Self Care | Payer: PPO | Source: Ambulatory Visit | Attending: Radiation Oncology | Admitting: Radiation Oncology

## 2022-04-25 DIAGNOSIS — C61 Malignant neoplasm of prostate: Secondary | ICD-10-CM | POA: Insufficient documentation

## 2022-04-25 DIAGNOSIS — Z510 Encounter for antineoplastic radiation therapy: Secondary | ICD-10-CM | POA: Insufficient documentation

## 2022-04-27 DIAGNOSIS — C4441 Basal cell carcinoma of skin of scalp and neck: Secondary | ICD-10-CM | POA: Diagnosis not present

## 2022-05-03 ENCOUNTER — Ambulatory Visit
Admission: RE | Admit: 2022-05-03 | Discharge: 2022-05-03 | Disposition: A | Discharge status: Home / Self Care | Payer: PPO | Source: Ambulatory Visit | Attending: Neurological Surgery | Admitting: Neurological Surgery

## 2022-05-03 DIAGNOSIS — D320 Benign neoplasm of cerebral meninges: Secondary | ICD-10-CM

## 2022-05-03 MED ORDER — GADOPICLENOL 0.5 MMOL/ML IV SOLN
9.0000 mL | Freq: Once | INTRAVENOUS | Status: AC | PRN
  Administered 2022-05-03: 9 mL via INTRAVENOUS

## 2022-05-04 DIAGNOSIS — C61 Malignant neoplasm of prostate: Secondary | ICD-10-CM | POA: Diagnosis not present

## 2022-05-09 DIAGNOSIS — N403 Nodular prostate with lower urinary tract symptoms: Secondary | ICD-10-CM | POA: Diagnosis not present

## 2022-05-09 DIAGNOSIS — C61 Malignant neoplasm of prostate: Secondary | ICD-10-CM | POA: Diagnosis not present

## 2022-05-09 DIAGNOSIS — N3281 Overactive bladder: Secondary | ICD-10-CM | POA: Diagnosis not present

## 2022-05-10 DIAGNOSIS — Z6829 Body mass index (BMI) 29.0-29.9, adult: Secondary | ICD-10-CM | POA: Diagnosis not present

## 2022-05-10 DIAGNOSIS — D320 Benign neoplasm of cerebral meninges: Secondary | ICD-10-CM | POA: Diagnosis not present

## 2022-05-16 DIAGNOSIS — Z85828 Personal history of other malignant neoplasm of skin: Secondary | ICD-10-CM | POA: Diagnosis not present

## 2022-05-16 DIAGNOSIS — R233 Spontaneous ecchymoses: Secondary | ICD-10-CM | POA: Diagnosis not present

## 2022-05-16 DIAGNOSIS — D2261 Melanocytic nevi of right upper limb, including shoulder: Secondary | ICD-10-CM | POA: Diagnosis not present

## 2022-05-16 DIAGNOSIS — L570 Actinic keratosis: Secondary | ICD-10-CM | POA: Diagnosis not present

## 2022-05-16 DIAGNOSIS — L814 Other melanin hyperpigmentation: Secondary | ICD-10-CM | POA: Diagnosis not present

## 2022-05-16 DIAGNOSIS — Z08 Encounter for follow-up examination after completed treatment for malignant neoplasm: Secondary | ICD-10-CM | POA: Diagnosis not present

## 2022-05-16 DIAGNOSIS — L821 Other seborrheic keratosis: Secondary | ICD-10-CM | POA: Diagnosis not present

## 2022-05-16 DIAGNOSIS — D225 Melanocytic nevi of trunk: Secondary | ICD-10-CM | POA: Diagnosis not present

## 2022-05-16 DIAGNOSIS — D2262 Melanocytic nevi of left upper limb, including shoulder: Secondary | ICD-10-CM | POA: Diagnosis not present

## 2022-05-16 DIAGNOSIS — B029 Zoster without complications: Secondary | ICD-10-CM | POA: Diagnosis not present

## 2022-05-16 DIAGNOSIS — D485 Neoplasm of uncertain behavior of skin: Secondary | ICD-10-CM | POA: Diagnosis not present

## 2022-06-14 DIAGNOSIS — C61 Malignant neoplasm of prostate: Secondary | ICD-10-CM | POA: Diagnosis not present

## 2022-06-14 DIAGNOSIS — N403 Nodular prostate with lower urinary tract symptoms: Secondary | ICD-10-CM | POA: Diagnosis not present

## 2022-06-14 DIAGNOSIS — R351 Nocturia: Secondary | ICD-10-CM | POA: Diagnosis not present

## 2022-06-14 DIAGNOSIS — N3281 Overactive bladder: Secondary | ICD-10-CM | POA: Diagnosis not present

## 2022-11-29 DIAGNOSIS — M1990 Unspecified osteoarthritis, unspecified site: Secondary | ICD-10-CM | POA: Diagnosis not present

## 2022-11-29 DIAGNOSIS — Z0000 Encounter for general adult medical examination without abnormal findings: Secondary | ICD-10-CM | POA: Diagnosis not present

## 2022-11-29 DIAGNOSIS — Z86018 Personal history of other benign neoplasm: Secondary | ICD-10-CM | POA: Diagnosis not present

## 2022-11-29 DIAGNOSIS — C61 Malignant neoplasm of prostate: Secondary | ICD-10-CM | POA: Diagnosis not present

## 2022-11-29 DIAGNOSIS — I10 Essential (primary) hypertension: Secondary | ICD-10-CM | POA: Diagnosis not present

## 2022-11-29 DIAGNOSIS — K5790 Diverticulosis of intestine, part unspecified, without perforation or abscess without bleeding: Secondary | ICD-10-CM | POA: Diagnosis not present

## 2022-11-29 DIAGNOSIS — E782 Mixed hyperlipidemia: Secondary | ICD-10-CM | POA: Diagnosis not present

## 2022-11-29 DIAGNOSIS — I700 Atherosclerosis of aorta: Secondary | ICD-10-CM | POA: Diagnosis not present

## 2022-12-13 DIAGNOSIS — C61 Malignant neoplasm of prostate: Secondary | ICD-10-CM | POA: Diagnosis not present

## 2022-12-13 DIAGNOSIS — I10 Essential (primary) hypertension: Secondary | ICD-10-CM | POA: Diagnosis not present

## 2022-12-13 DIAGNOSIS — Z23 Encounter for immunization: Secondary | ICD-10-CM | POA: Diagnosis not present

## 2022-12-20 DIAGNOSIS — C61 Malignant neoplasm of prostate: Secondary | ICD-10-CM | POA: Diagnosis not present

## 2022-12-20 DIAGNOSIS — N3281 Overactive bladder: Secondary | ICD-10-CM | POA: Diagnosis not present

## 2022-12-20 DIAGNOSIS — N403 Nodular prostate with lower urinary tract symptoms: Secondary | ICD-10-CM | POA: Diagnosis not present

## 2022-12-20 DIAGNOSIS — R351 Nocturia: Secondary | ICD-10-CM | POA: Diagnosis not present

## 2023-02-18 IMAGING — CT CT HEAD W/O CM
3 series · 14 of 47 positions shown, 16 images · non-contrast
Comparison: MRI August 08, 21.

CLINICAL DATA: Meningioma



[Series 2: head 5.0 h30s · axial · 0.48mm/px · z∈[-107,+43]mm · 8 of 36 slices shown, 10 images]
[im 3/36  brain]
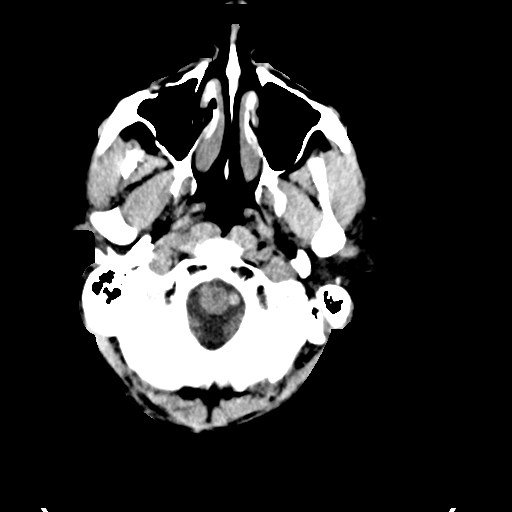
[im 3/36  bone]
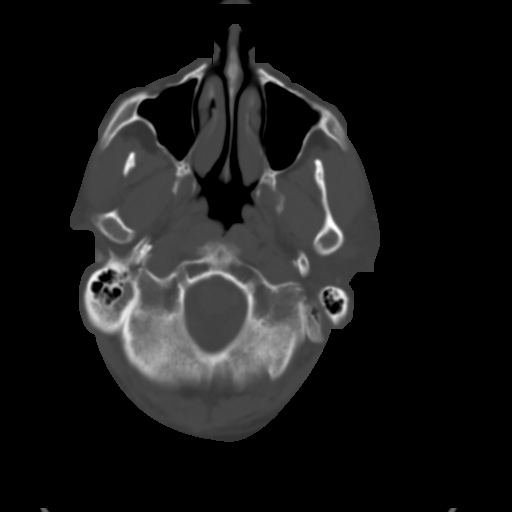
[im 8/36  brain]
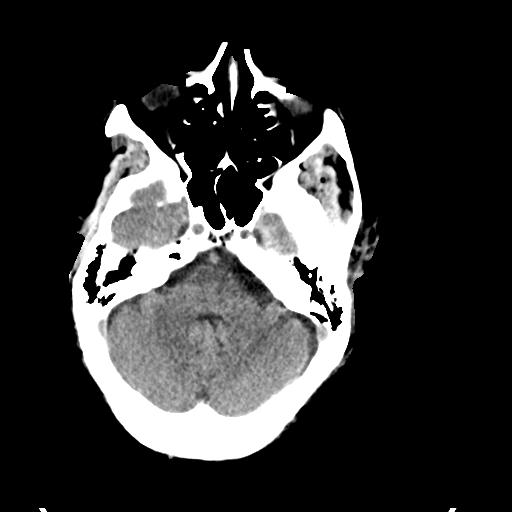
[im 11/36  brain]
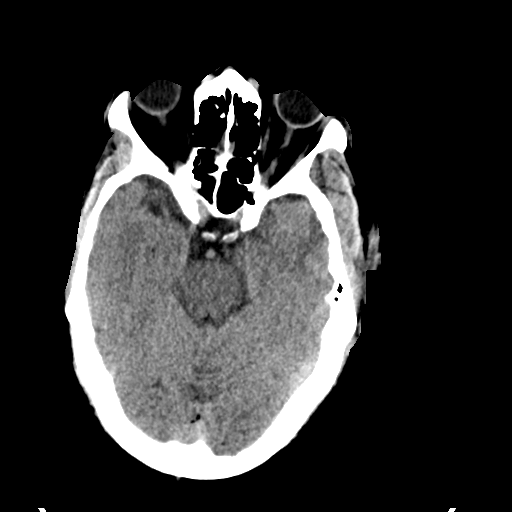
[im 16/36  brain]
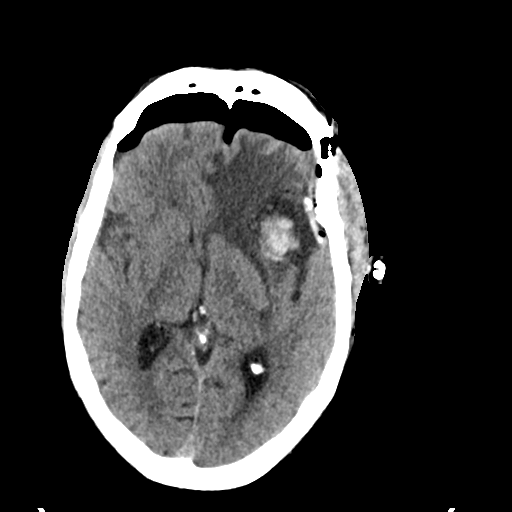
[im 20/36  brain]
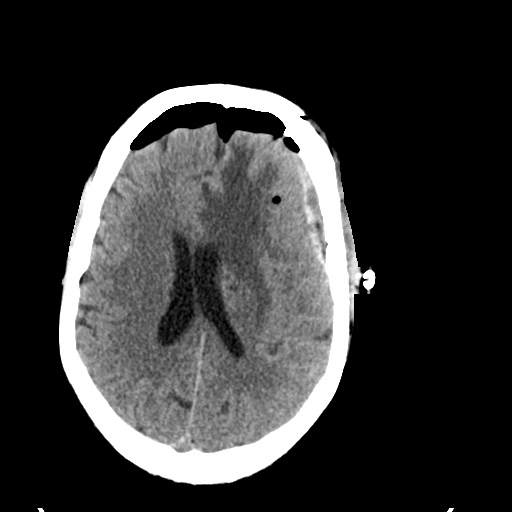
[im 20/36  bone]
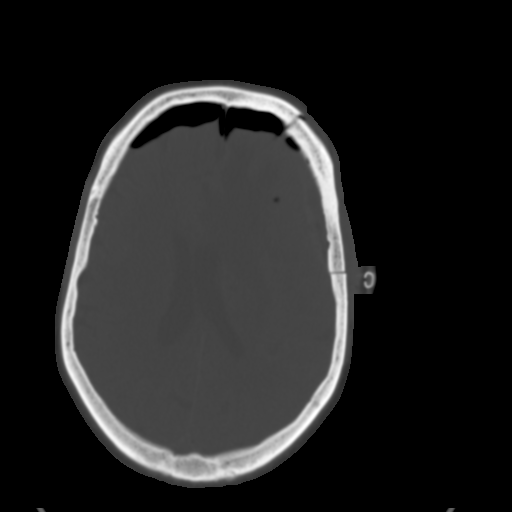
[im 25/36  brain]
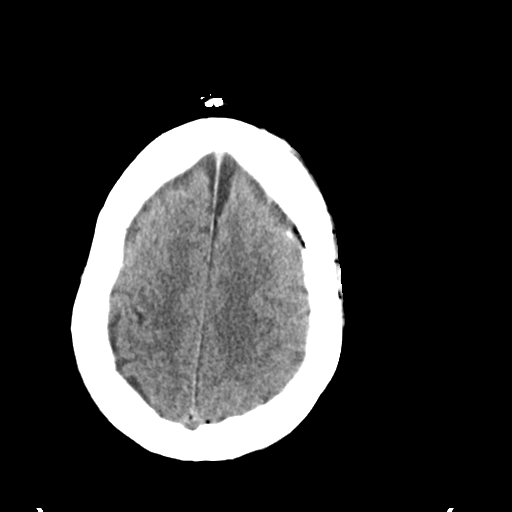
[im 28/36  brain]
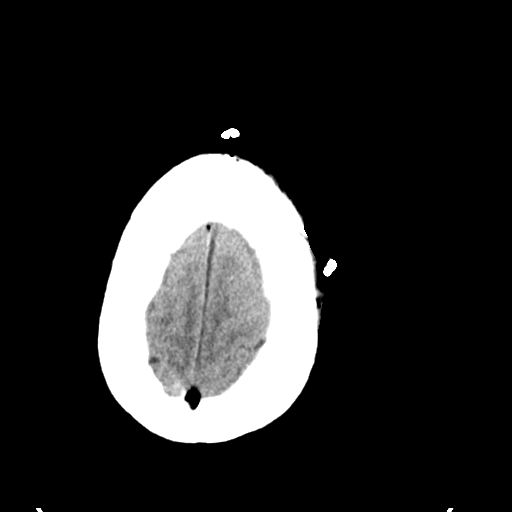
[im 33/36  brain]
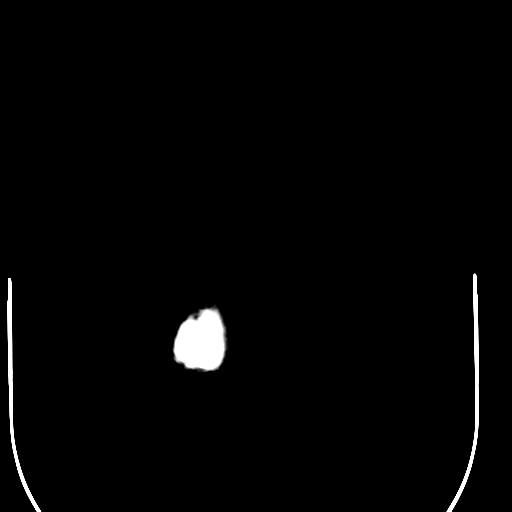

[Series 4: head 3.0 mpr cor · coronal · 0.35mm/px · 3 of 75 slices shown]
[im 25/75  brain]
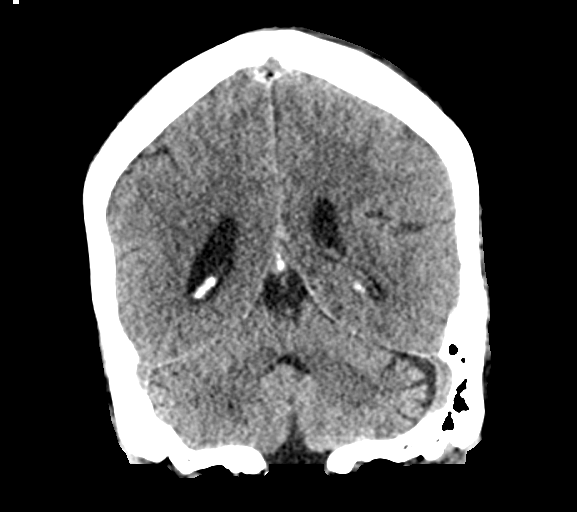
[im 33/75  brain]
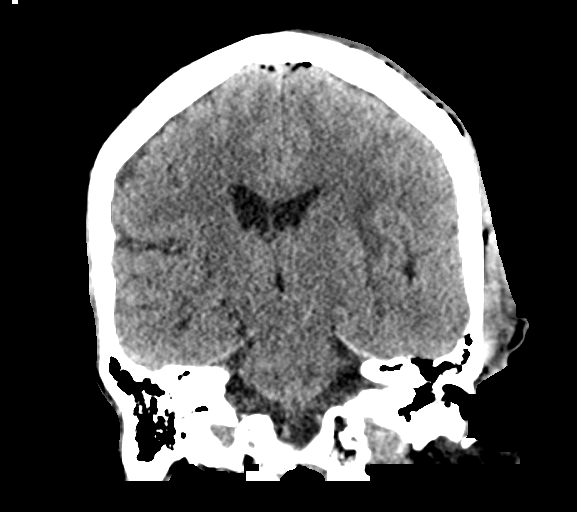
[im 42/75  brain]
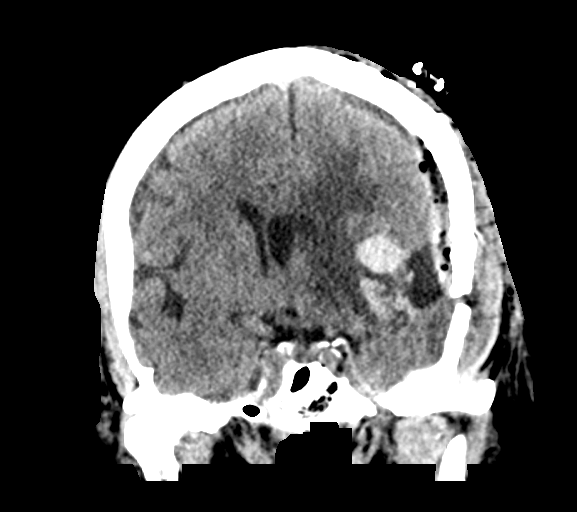

[Series 5: head 3.0 mpr sag · sagittal · 0.35mm/px · 3 of 67 slices shown]
[im 23/67  brain]
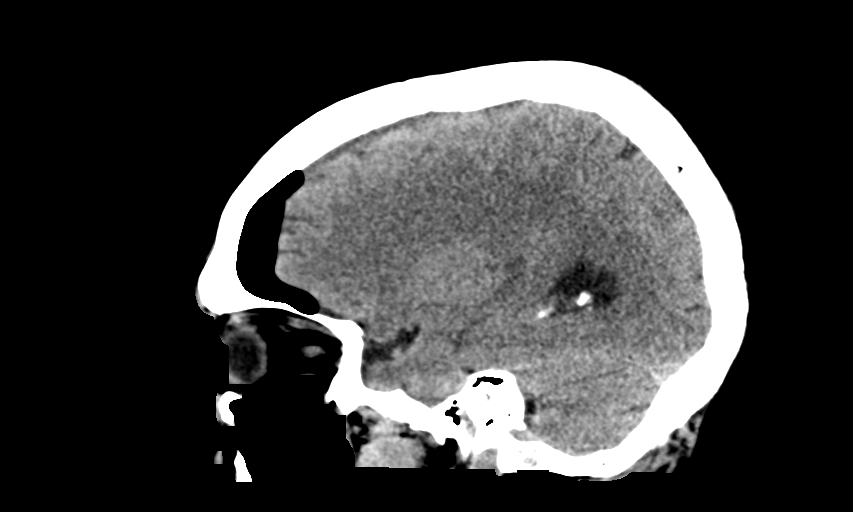
[im 34/67  brain]
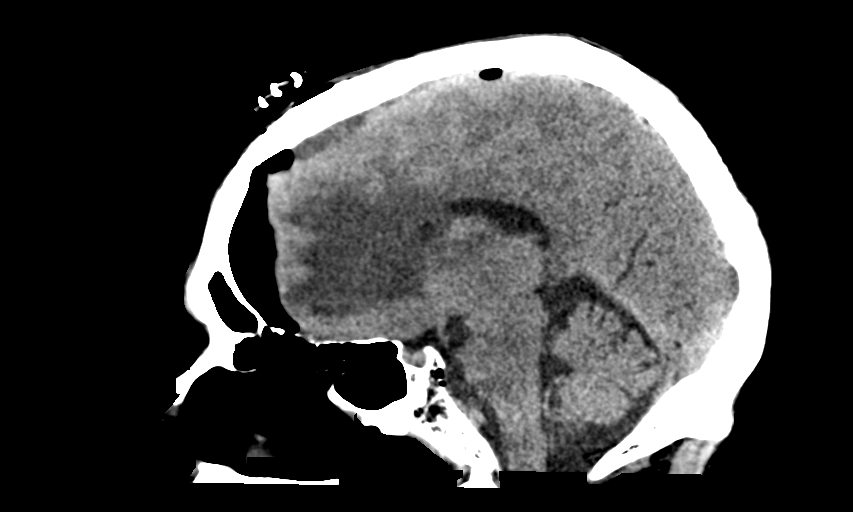
[im 45/67  brain]
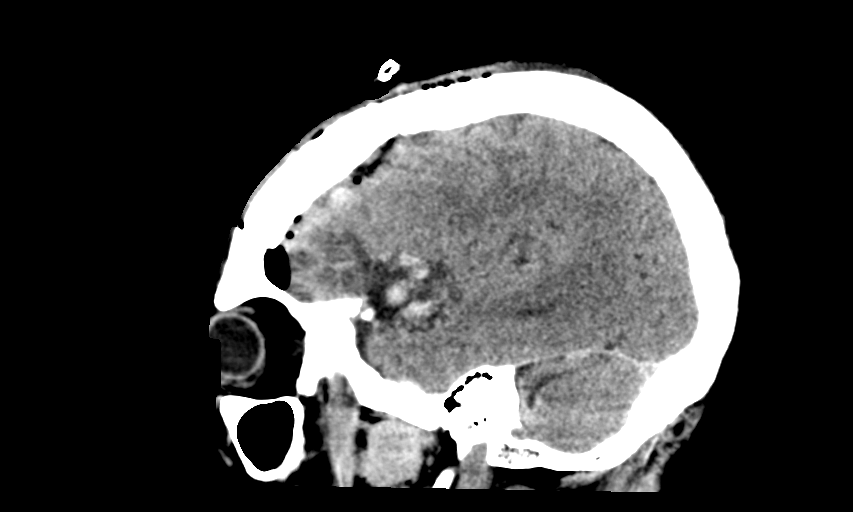

[14 of 47 positions shown; findings below may reference images not displayed]

FINDINGS: Brain: Heterogeneous acute intraparenchymal hemorrhage within the
left frontal lobe at the site of recent meningioma resection,
measuring up to 2.1 x 1.7 x 3.5 cm on series 2, image 17 and series
5, image 43 (estimated volume of 6.2 mL). Small volume of overlying
extra-axial hemorrhage. Moderate anti dependent pneumocephalus.
Surrounding edema. Overall mass effect is improved relative to
preoperative MRI with up to 11 mm of rightward midline shift.

No evidence of acute large vascular territory infarct or
hydrocephalus.

Vascular: Calcific intracranial sclerosis.

Skull: Left frontal craniotomy. Overlying skin staples and scalp
edema/subcutaneous gas.

Sinuses/Orbits: Minimal paranasal sinus mucosal thickening. No acute
orbital findings.

Other: No mastoid effusions.  Cerumen in the EACs.
IMPRESSION: Acute 3.5 cm intraparenchymal hemorrhage in the left frontal lobe at
the site of recent meningioma resection, described above. Small
volume of overlying extra-axial hemorrhage and moderate
antidependent pneumocephalus. Overall mass effect is mildly improved
with approximately 11 mm of rightward midline shift.

These results will be called to the ordering clinician or
representative by the Radiologist Assistant, and communication
documented in the PACS or [REDACTED].

## 2023-04-11 ENCOUNTER — Other Ambulatory Visit: Payer: Self-pay | Admitting: Neurological Surgery

## 2023-04-11 DIAGNOSIS — D320 Benign neoplasm of cerebral meninges: Secondary | ICD-10-CM

## 2023-05-21 ENCOUNTER — Ambulatory Visit
Admission: RE | Admit: 2023-05-21 | Discharge: 2023-05-21 | Disposition: A | Discharge status: Home / Self Care | Payer: PPO | Source: Ambulatory Visit | Attending: Neurological Surgery | Admitting: Neurological Surgery

## 2023-05-21 DIAGNOSIS — D320 Benign neoplasm of cerebral meninges: Secondary | ICD-10-CM

## 2023-05-21 MED ORDER — GADOPICLENOL 0.5 MMOL/ML IV SOLN
8.0000 mL | Freq: Once | INTRAVENOUS | Status: AC | PRN
  Administered 2023-05-21: 8 mL via INTRAVENOUS

## 2023-05-23 ENCOUNTER — Other Ambulatory Visit: Payer: Self-pay | Admitting: Radiation Therapy

## 2023-05-28 ENCOUNTER — Inpatient Hospital Stay: Payer: PPO | Attending: Neurological Surgery

## 2023-05-30 DIAGNOSIS — N182 Chronic kidney disease, stage 2 (mild): Secondary | ICD-10-CM | POA: Diagnosis not present

## 2023-05-30 DIAGNOSIS — C61 Malignant neoplasm of prostate: Secondary | ICD-10-CM | POA: Diagnosis not present

## 2023-05-30 DIAGNOSIS — D329 Benign neoplasm of meninges, unspecified: Secondary | ICD-10-CM | POA: Diagnosis not present

## 2023-05-30 DIAGNOSIS — E782 Mixed hyperlipidemia: Secondary | ICD-10-CM | POA: Diagnosis not present

## 2023-05-30 DIAGNOSIS — Z131 Encounter for screening for diabetes mellitus: Secondary | ICD-10-CM | POA: Diagnosis not present

## 2023-05-30 DIAGNOSIS — I10 Essential (primary) hypertension: Secondary | ICD-10-CM | POA: Diagnosis not present

## 2023-07-04 DIAGNOSIS — C61 Malignant neoplasm of prostate: Secondary | ICD-10-CM | POA: Diagnosis not present

## 2023-07-11 DIAGNOSIS — R351 Nocturia: Secondary | ICD-10-CM | POA: Diagnosis not present

## 2023-07-11 DIAGNOSIS — Z8546 Personal history of malignant neoplasm of prostate: Secondary | ICD-10-CM | POA: Diagnosis not present

## 2023-08-13 DIAGNOSIS — I10 Essential (primary) hypertension: Secondary | ICD-10-CM | POA: Diagnosis not present

## 2023-08-13 DIAGNOSIS — M545 Low back pain, unspecified: Secondary | ICD-10-CM | POA: Diagnosis not present

## 2023-08-23 DIAGNOSIS — R233 Spontaneous ecchymoses: Secondary | ICD-10-CM | POA: Diagnosis not present

## 2023-08-23 DIAGNOSIS — L308 Other specified dermatitis: Secondary | ICD-10-CM | POA: Diagnosis not present

## 2023-09-12 DIAGNOSIS — I10 Essential (primary) hypertension: Secondary | ICD-10-CM | POA: Diagnosis not present

## 2023-09-12 DIAGNOSIS — N182 Chronic kidney disease, stage 2 (mild): Secondary | ICD-10-CM | POA: Diagnosis not present

## 2023-09-17 DIAGNOSIS — H35033 Hypertensive retinopathy, bilateral: Secondary | ICD-10-CM | POA: Diagnosis not present

## 2023-09-17 DIAGNOSIS — H524 Presbyopia: Secondary | ICD-10-CM | POA: Diagnosis not present

## 2023-09-24 DIAGNOSIS — C61 Malignant neoplasm of prostate: Secondary | ICD-10-CM | POA: Diagnosis not present

## 2023-09-24 DIAGNOSIS — I10 Essential (primary) hypertension: Secondary | ICD-10-CM | POA: Diagnosis not present

## 2023-09-24 DIAGNOSIS — N182 Chronic kidney disease, stage 2 (mild): Secondary | ICD-10-CM | POA: Diagnosis not present

## 2023-09-24 DIAGNOSIS — M1990 Unspecified osteoarthritis, unspecified site: Secondary | ICD-10-CM | POA: Diagnosis not present

## 2023-10-11 DIAGNOSIS — N182 Chronic kidney disease, stage 2 (mild): Secondary | ICD-10-CM | POA: Diagnosis not present

## 2023-10-11 DIAGNOSIS — I10 Essential (primary) hypertension: Secondary | ICD-10-CM | POA: Diagnosis not present

## 2023-10-25 DIAGNOSIS — C61 Malignant neoplasm of prostate: Secondary | ICD-10-CM | POA: Diagnosis not present

## 2023-10-25 DIAGNOSIS — M1990 Unspecified osteoarthritis, unspecified site: Secondary | ICD-10-CM | POA: Diagnosis not present

## 2023-10-25 DIAGNOSIS — I10 Essential (primary) hypertension: Secondary | ICD-10-CM | POA: Diagnosis not present

## 2023-10-25 DIAGNOSIS — N182 Chronic kidney disease, stage 2 (mild): Secondary | ICD-10-CM | POA: Diagnosis not present

## 2023-11-10 DIAGNOSIS — I10 Essential (primary) hypertension: Secondary | ICD-10-CM | POA: Diagnosis not present

## 2023-11-10 DIAGNOSIS — N182 Chronic kidney disease, stage 2 (mild): Secondary | ICD-10-CM | POA: Diagnosis not present

## 2023-11-25 DIAGNOSIS — M1990 Unspecified osteoarthritis, unspecified site: Secondary | ICD-10-CM | POA: Diagnosis not present

## 2023-11-25 DIAGNOSIS — C61 Malignant neoplasm of prostate: Secondary | ICD-10-CM | POA: Diagnosis not present

## 2023-11-25 DIAGNOSIS — I10 Essential (primary) hypertension: Secondary | ICD-10-CM | POA: Diagnosis not present

## 2023-11-25 DIAGNOSIS — N182 Chronic kidney disease, stage 2 (mild): Secondary | ICD-10-CM | POA: Diagnosis not present

## 2023-11-30 DIAGNOSIS — Z23 Encounter for immunization: Secondary | ICD-10-CM | POA: Diagnosis not present

## 2023-11-30 DIAGNOSIS — I10 Essential (primary) hypertension: Secondary | ICD-10-CM | POA: Diagnosis not present

## 2023-11-30 DIAGNOSIS — Z1331 Encounter for screening for depression: Secondary | ICD-10-CM | POA: Diagnosis not present

## 2023-11-30 DIAGNOSIS — Z131 Encounter for screening for diabetes mellitus: Secondary | ICD-10-CM | POA: Diagnosis not present

## 2023-11-30 DIAGNOSIS — K5790 Diverticulosis of intestine, part unspecified, without perforation or abscess without bleeding: Secondary | ICD-10-CM | POA: Diagnosis not present

## 2023-11-30 DIAGNOSIS — H6123 Impacted cerumen, bilateral: Secondary | ICD-10-CM | POA: Diagnosis not present

## 2023-11-30 DIAGNOSIS — C61 Malignant neoplasm of prostate: Secondary | ICD-10-CM | POA: Diagnosis not present

## 2023-11-30 DIAGNOSIS — Z86018 Personal history of other benign neoplasm: Secondary | ICD-10-CM | POA: Diagnosis not present

## 2023-11-30 DIAGNOSIS — M549 Dorsalgia, unspecified: Secondary | ICD-10-CM | POA: Diagnosis not present

## 2023-11-30 DIAGNOSIS — E782 Mixed hyperlipidemia: Secondary | ICD-10-CM | POA: Diagnosis not present

## 2023-11-30 DIAGNOSIS — Z0000 Encounter for general adult medical examination without abnormal findings: Secondary | ICD-10-CM | POA: Diagnosis not present

## 2023-12-10 DIAGNOSIS — N182 Chronic kidney disease, stage 2 (mild): Secondary | ICD-10-CM | POA: Diagnosis not present

## 2023-12-10 DIAGNOSIS — I10 Essential (primary) hypertension: Secondary | ICD-10-CM | POA: Diagnosis not present

## 2023-12-25 DIAGNOSIS — M1990 Unspecified osteoarthritis, unspecified site: Secondary | ICD-10-CM | POA: Diagnosis not present

## 2023-12-25 DIAGNOSIS — I10 Essential (primary) hypertension: Secondary | ICD-10-CM | POA: Diagnosis not present

## 2023-12-25 DIAGNOSIS — N182 Chronic kidney disease, stage 2 (mild): Secondary | ICD-10-CM | POA: Diagnosis not present

## 2023-12-25 DIAGNOSIS — C61 Malignant neoplasm of prostate: Secondary | ICD-10-CM | POA: Diagnosis not present

## 2024-01-09 DIAGNOSIS — I10 Essential (primary) hypertension: Secondary | ICD-10-CM | POA: Diagnosis not present

## 2024-01-09 DIAGNOSIS — N182 Chronic kidney disease, stage 2 (mild): Secondary | ICD-10-CM | POA: Diagnosis not present

## 2024-01-25 DIAGNOSIS — C61 Malignant neoplasm of prostate: Secondary | ICD-10-CM | POA: Diagnosis not present

## 2024-01-25 DIAGNOSIS — I10 Essential (primary) hypertension: Secondary | ICD-10-CM | POA: Diagnosis not present

## 2024-01-25 DIAGNOSIS — M1990 Unspecified osteoarthritis, unspecified site: Secondary | ICD-10-CM | POA: Diagnosis not present

## 2024-01-25 DIAGNOSIS — N182 Chronic kidney disease, stage 2 (mild): Secondary | ICD-10-CM | POA: Diagnosis not present

## 2024-02-08 DIAGNOSIS — N182 Chronic kidney disease, stage 2 (mild): Secondary | ICD-10-CM | POA: Diagnosis not present

## 2024-02-08 DIAGNOSIS — I10 Essential (primary) hypertension: Secondary | ICD-10-CM | POA: Diagnosis not present

## 2024-02-20 DIAGNOSIS — N182 Chronic kidney disease, stage 2 (mild): Secondary | ICD-10-CM | POA: Diagnosis not present

## 2024-02-20 DIAGNOSIS — R319 Hematuria, unspecified: Secondary | ICD-10-CM | POA: Diagnosis not present

## 2024-02-20 DIAGNOSIS — M549 Dorsalgia, unspecified: Secondary | ICD-10-CM | POA: Diagnosis not present

## 2024-02-20 DIAGNOSIS — H00019 Hordeolum externum unspecified eye, unspecified eyelid: Secondary | ICD-10-CM | POA: Diagnosis not present

## 2024-02-24 DIAGNOSIS — M1990 Unspecified osteoarthritis, unspecified site: Secondary | ICD-10-CM | POA: Diagnosis not present

## 2024-02-24 DIAGNOSIS — I10 Essential (primary) hypertension: Secondary | ICD-10-CM | POA: Diagnosis not present

## 2024-02-24 DIAGNOSIS — C61 Malignant neoplasm of prostate: Secondary | ICD-10-CM | POA: Diagnosis not present

## 2024-02-24 DIAGNOSIS — N182 Chronic kidney disease, stage 2 (mild): Secondary | ICD-10-CM | POA: Diagnosis not present

## 2024-02-27 ENCOUNTER — Ambulatory Visit
Admission: RE | Admit: 2024-02-27 | Discharge: 2024-02-27 | Disposition: A | Source: Ambulatory Visit | Attending: Family Medicine | Admitting: Family Medicine

## 2024-02-27 ENCOUNTER — Other Ambulatory Visit: Payer: Self-pay | Admitting: Family Medicine

## 2024-02-27 DIAGNOSIS — M5441 Lumbago with sciatica, right side: Secondary | ICD-10-CM

## 2024-02-27 DIAGNOSIS — M4316 Spondylolisthesis, lumbar region: Secondary | ICD-10-CM | POA: Diagnosis not present

## 2024-02-27 DIAGNOSIS — M5116 Intervertebral disc disorders with radiculopathy, lumbar region: Secondary | ICD-10-CM | POA: Diagnosis not present

## 2024-04-15 ENCOUNTER — Other Ambulatory Visit: Payer: Self-pay | Admitting: Neurological Surgery

## 2024-04-15 DIAGNOSIS — D320 Benign neoplasm of cerebral meninges: Secondary | ICD-10-CM

## 2024-05-13 ENCOUNTER — Other Ambulatory Visit
